# Patient Record
Sex: Female | Born: 1965 | Race: White | Hispanic: No | Smoking: Current every day smoker
Health system: Southern US, Community
[De-identification: ages and names within clinical notes are randomized; demographics above are authoritative.]

## PROBLEM LIST (undated history)

## (undated) DIAGNOSIS — M1990 Unspecified osteoarthritis, unspecified site: Secondary | ICD-10-CM

## (undated) DIAGNOSIS — T7840XA Allergy, unspecified, initial encounter: Secondary | ICD-10-CM

## (undated) DIAGNOSIS — M810 Age-related osteoporosis without current pathological fracture: Secondary | ICD-10-CM

## (undated) DIAGNOSIS — J45909 Unspecified asthma, uncomplicated: Secondary | ICD-10-CM

## (undated) HISTORY — DX: Age-related osteoporosis without current pathological fracture: M81.0

## (undated) HISTORY — DX: Unspecified asthma, uncomplicated: J45.909

## (undated) HISTORY — PX: OTHER SURGICAL HISTORY: SHX169

## (undated) HISTORY — DX: Allergy, unspecified, initial encounter: T78.40XA

## (undated) HISTORY — DX: Unspecified osteoarthritis, unspecified site: M19.90

---

## 1998-04-03 ENCOUNTER — Other Ambulatory Visit: Admission: RE | Admit: 1998-04-03 | Discharge: 1998-04-03 | Payer: Self-pay | Admitting: *Deleted

## 1999-04-16 ENCOUNTER — Other Ambulatory Visit: Admission: RE | Admit: 1999-04-16 | Discharge: 1999-04-16 | Payer: Self-pay | Admitting: *Deleted

## 2000-04-24 ENCOUNTER — Other Ambulatory Visit: Admission: RE | Admit: 2000-04-24 | Discharge: 2000-04-24 | Payer: Self-pay | Admitting: *Deleted

## 2000-10-23 ENCOUNTER — Other Ambulatory Visit: Admission: RE | Admit: 2000-10-23 | Discharge: 2000-10-23 | Payer: Self-pay | Admitting: Obstetrics and Gynecology

## 2001-04-26 ENCOUNTER — Other Ambulatory Visit: Admission: RE | Admit: 2001-04-26 | Discharge: 2001-04-26 | Payer: Self-pay | Admitting: Obstetrics and Gynecology

## 2002-04-27 ENCOUNTER — Other Ambulatory Visit: Admission: RE | Admit: 2002-04-27 | Discharge: 2002-04-27 | Payer: Self-pay | Admitting: Obstetrics and Gynecology

## 2003-05-29 ENCOUNTER — Other Ambulatory Visit: Admission: RE | Admit: 2003-05-29 | Discharge: 2003-05-29 | Payer: Self-pay | Admitting: Obstetrics and Gynecology

## 2004-06-22 DIAGNOSIS — M810 Age-related osteoporosis without current pathological fracture: Secondary | ICD-10-CM

## 2004-06-22 HISTORY — DX: Age-related osteoporosis without current pathological fracture: M81.0

## 2005-09-27 ENCOUNTER — Emergency Department (HOSPITAL_COMMUNITY): Admission: EM | Admit: 2005-09-27 | Discharge: 2005-09-27 | Payer: Self-pay | Admitting: Emergency Medicine

## 2007-03-17 ENCOUNTER — Emergency Department (HOSPITAL_COMMUNITY): Admission: EM | Admit: 2007-03-17 | Discharge: 2007-03-17 | Payer: Self-pay | Admitting: Family Medicine

## 2007-05-29 ENCOUNTER — Emergency Department (HOSPITAL_COMMUNITY): Admission: EM | Admit: 2007-05-29 | Discharge: 2007-05-29 | Payer: Self-pay | Admitting: Emergency Medicine

## 2008-01-03 ENCOUNTER — Ambulatory Visit: Payer: Self-pay | Admitting: Family Medicine

## 2008-01-03 DIAGNOSIS — M79609 Pain in unspecified limb: Secondary | ICD-10-CM

## 2009-03-29 ENCOUNTER — Emergency Department (HOSPITAL_COMMUNITY): Admission: EM | Admit: 2009-03-29 | Discharge: 2009-03-29 | Payer: Self-pay | Admitting: Emergency Medicine

## 2011-12-04 ENCOUNTER — Ambulatory Visit (HOSPITAL_COMMUNITY)
Admission: RE | Admit: 2011-12-04 | Discharge: 2011-12-04 | Disposition: A | Discharge status: Home / Self Care | Payer: 59 | Source: Ambulatory Visit | Attending: Sports Medicine | Admitting: Sports Medicine

## 2011-12-04 ENCOUNTER — Ambulatory Visit (INDEPENDENT_AMBULATORY_CARE_PROVIDER_SITE_OTHER): Payer: 59 | Admitting: Sports Medicine

## 2011-12-04 VITALS — BP 100/68 | Ht 65.0 in | Wt 125.0 lb

## 2011-12-04 DIAGNOSIS — M79609 Pain in unspecified limb: Secondary | ICD-10-CM

## 2011-12-04 DIAGNOSIS — M25579 Pain in unspecified ankle and joints of unspecified foot: Secondary | ICD-10-CM | POA: Insufficient documentation

## 2011-12-04 DIAGNOSIS — M25571 Pain in right ankle and joints of right foot: Secondary | ICD-10-CM | POA: Insufficient documentation

## 2011-12-04 DIAGNOSIS — M25476 Effusion, unspecified foot: Secondary | ICD-10-CM | POA: Insufficient documentation

## 2011-12-04 DIAGNOSIS — M25473 Effusion, unspecified ankle: Secondary | ICD-10-CM | POA: Insufficient documentation

## 2011-12-04 MED ORDER — MELOXICAM 15 MG PO TABS: ORAL_TABLET | ORAL | Status: DC

## 2011-12-04 NOTE — Progress Notes (Signed)
  Subjective:    Patient ID: Kristen Brewer, female    DOB: 29-Jan-1966, 46 y.o.   MRN: [id number]  HPI chief complaint: Right ankle pain  Very pleasant 46 year old female comes in today complaining of 2 weeks of right ankle pain and swelling. She initially began to have problems with this ankle 2 years ago. She had increased pain and swelling with an exercise class that was not associated with any specific trauma. Since that time she's had intermittent pain and swelling. She localizes her pain to the anterior lateral aspect of the ankle but states that the swelling is more diffuse. Swelling is worse at the end of the day. She has not had any recent x-rays of her ankle. She describes the pain as aching in quality and constantly present. It is not made worse with any particular motion or activity. No prior ankle surgeries. She does get occasional numbness and tingling into her foot. No fevers or chills. She's taking both ibuprofen as well as Goody's powders, both of which helped intermittently.  She denies any past medical problems. She takes no chronic medications. She is allergic to codeine.    Review of Systems     Objective:   Physical Exam Well-developed, well-nourished. No acute distress. Awake alert and oriented x3  Right ankle: Full painless range of motion. Positive anterior drawer with a 2+ talar tilt but this is equal to the uninvolved left ankle. Mild soft tissue swelling over the area of the ATF with mild tenderness to palpation here. Slight tenderness to palpation along the peroneal tendons just posterior to the distal fibula but no real pain with resisted foot eversion or dorsiflexion. There is no tenderness along the medial ankle. No tenderness to palpation at the base of the fifth metatarsal or over the navicular. No pain with metatarsal squeeze. She is neurovascular intact distally. She walks without a significant limp.  MSK ultrasound of the right ankle: Limited views of the  peroneal tendons were obtained. Tendons appear to be within normal limits. I do not appreciate any thickening or tearing. No areas of edema or neovascularity.        Assessment & Plan:  1. Right ankle pain of unknown etiology  I'm going to start with x-rays of the right ankle. Her swelling make me suspicious of some sort of joint effusion. I've given her a body helix compression sleeve and a pair of green sports insoles. She's also educated in a home exercise program consisting of Theraband exercises. I will put her on Mobic 15 mg daily for 7 days then when necessary she will followup with me in 3 weeks. If symptoms persist and x-rays are unremarkable we may need to pursue further diagnostic imaging as well as blood work to rule out possible systemic sources of ankle swelling.

## 2011-12-05 ENCOUNTER — Telehealth: Payer: Self-pay | Admitting: Sports Medicine

## 2011-12-05 NOTE — Telephone Encounter (Signed)
I spoke with the patient today on the phone regarding x-rays of her right ankle. X-rays are fairly unremarkable other than some diffuse osteopenia. No significant degenerative changes. No obvious OCD. She will continue with treatment as outlined at yesterday's office visit and will followup with me as scheduled.

## 2011-12-25 ENCOUNTER — Ambulatory Visit (INDEPENDENT_AMBULATORY_CARE_PROVIDER_SITE_OTHER): Payer: 59 | Admitting: Sports Medicine

## 2011-12-25 VITALS — BP 90/60 | Ht 65.0 in | Wt 125.0 lb

## 2011-12-25 DIAGNOSIS — M25373 Other instability, unspecified ankle: Secondary | ICD-10-CM

## 2011-12-25 DIAGNOSIS — M24876 Other specific joint derangements of unspecified foot, not elsewhere classified: Secondary | ICD-10-CM

## 2011-12-25 DIAGNOSIS — M25579 Pain in unspecified ankle and joints of unspecified foot: Secondary | ICD-10-CM

## 2011-12-26 NOTE — Progress Notes (Addendum)
  Subjective:    Patient ID: Kristen Brewer, female    DOB: March 03, 1965, 46 y.o.   MRN: [id number]  HPI Patient comes in today for followup on her right ankle pain and swelling. Symptoms are much improved. Mobic was beneficial and she is now taking it when necessary. Body helix compression sleeve has also been helpful. She wears it when she is active. She has been doing some generalized ankle strengthening with a theraband. Overall she is feeling much better.    Review of Systems     Objective:   Physical Exam Well-developed, well-nourished. No acute distress.  Right ankle: Full range of motion. No effusion. No soft tissue swelling. There is no tenderness to palpation over the ATF or along the peroneal tendons. No tenderness along the Achilles tendon. She still has a 2+ talar tilt are 1+ anterior drawer. No tenderness along the medial ankle. Neurovascularly intact distally. Walking without a limp.  X-rays of the right ankle including AP mortise and lateral views are reviewed. Nothing acute. No joint effusion. No obvious OCD or degenerative changes.      Assessment & Plan:  1. Improved right ankle pain due to to ankle instability  Patient understands the importance of continuing with generalized ankle strengthening given her underlying laxity. She can continue with Mobic when necessary and continue with her compression sleeve when necessary. She can increase activity as tolerated. She will followup if symptoms recur. Patient was given a note to give to her employer asking that she be allowed to wear her tennis shoes while at work.

## 2012-05-28 ENCOUNTER — Ambulatory Visit: Payer: 59

## 2012-05-28 ENCOUNTER — Ambulatory Visit (INDEPENDENT_AMBULATORY_CARE_PROVIDER_SITE_OTHER): Payer: 59 | Admitting: Emergency Medicine

## 2012-05-28 VITALS — BP 95/64 | HR 88 | Temp 98.9°F | Resp 18 | Wt 128.0 lb

## 2012-05-28 DIAGNOSIS — L0390 Cellulitis, unspecified: Secondary | ICD-10-CM

## 2012-05-28 DIAGNOSIS — L0291 Cutaneous abscess, unspecified: Secondary | ICD-10-CM

## 2012-05-28 LAB — POCT CBC
HCT, POC: 41.6 % (ref 37.7–47.9)
Hemoglobin: 13.2 g/dL (ref 12.2–16.2)
Lymph, poc: 3.1 (ref 0.6–3.4)
MCH, POC: 30.9 pg (ref 27–31.2)
MCHC: 31.7 g/dL — AB (ref 31.8–35.4)
WBC: 10.3 10*3/uL — AB (ref 4.6–10.2)

## 2012-05-28 LAB — POCT SEDIMENTATION RATE: POCT SED RATE: 18 mm/hr (ref 0–22)

## 2012-05-28 MED ORDER — AMOXICILLIN-POT CLAVULANATE 875-125 MG PO TABS: 1.0000 | ORAL_TABLET | Freq: Two times a day (BID) | ORAL | Status: DC

## 2012-05-28 NOTE — Patient Instructions (Signed)
Cellulitis Cellulitis is an infection of the skin and the tissue beneath it. The infected area is usually red and tender. Cellulitis occurs most often in the arms and lower legs.   CAUSES   Cellulitis is caused by bacteria that enter the skin through cracks or cuts in the skin. The most common types of bacteria that cause cellulitis are Staphylococcus and Streptococcus. SYMPTOMS    Redness and warmth.   Swelling.   Tenderness or pain.   Fever.  DIAGNOSIS  Your caregiver can usually determine what is wrong based on a physical exam. Blood tests may also be done. TREATMENT   Treatment usually involves taking an antibiotic medicine. HOME CARE INSTRUCTIONS    Take your antibiotics as directed. Finish them even if you start to feel better.   Keep the infected arm or leg elevated to reduce swelling.   Apply a warm cloth to the affected area up to 4 times per day to relieve pain.   Only take over-the-counter or prescription medicines for pain, discomfort, or fever as directed by your caregiver.   Keep all follow-up appointments as directed by your caregiver.  SEEK MEDICAL CARE IF:    You notice red streaks coming from the infected area.   Your red area gets larger or turns dark in color.   Your bone or joint underneath the infected area becomes painful after the skin has healed.   Your infection returns in the same area or another area.   You notice a swollen bump in the infected area.   You develop new symptoms.  SEEK IMMEDIATE MEDICAL CARE IF:    You have a fever.   You feel very sleepy.   You develop vomiting or diarrhea.   You have a general ill feeling (malaise) with muscle aches and pains.  MAKE SURE YOU:    Understand these instructions.   Will watch your condition.   Will get help right away if you are not doing well or get worse.  Document Released: 11/13/2004 Document Revised: 08/05/2011 Document Reviewed: 04/21/2011 [employer] Patient Information 2013  [employer]    

## 2012-05-28 NOTE — Progress Notes (Signed)
  Subjective:    Patient ID: Kristen Brewer, female    DOB: Apr 15, 1965, 47 y.o.   MRN: [id number]  HPI 47 yo female who woke up with itchy, red spot at top of R knee. Reports that it burns, not really painful. Has drawn lines to show spreading of rash. Is roughly 5cm currently. No known injury to knee. States one of her cats may have scratched her. Never had anything like this before. Denies fever/chills. Denies calf or hip pain. Took benadryl and it has continued to increase in size.    Review of Systems  Constitutional: Negative for fever and chills.  Respiratory: Negative.   Cardiovascular: Negative.   Gastrointestinal: Negative.        Objective:   Physical Exam  Constitutional: She appears well-developed and well-nourished.  Cardiovascular: Normal rate and regular rhythm.   Pulmonary/Chest: Effort normal and breath sounds normal.  Musculoskeletal:       Legs: R knee has 5 cm circular area of induration. It is erythematous, swollen, and hot to touch. Full ROM. Knee is stable.    Results for orders placed in visit on 05/28/12  POCT CBC      Result Value Range   WBC 10.3 (*) 4.6 - 10.2 K/uL   Lymph, poc 3.1  0.6 - 3.4   POC LYMPH PERCENT 29.8  10 - 50 %L   MID (cbc) 0.6  0 - 0.9   POC MID % 6.1  0 - 12 %M   POC Granulocyte 6.6  2 - 6.9   Granulocyte percent 64.1  37 - 80 %G   RBC 4.27  4.04 - 5.48 M/uL   Hemoglobin 13.2  12.2 - 16.2 g/dL   HCT, POC 64.3  32.9 - 47.9 %   MCV 97.4 (*) 80 - 97 fL   MCH, POC 30.9  27 - 31.2 pg   MCHC 31.7 (*) 31.8 - 35.4 g/dL   RDW, POC 51.8     Platelet Count, POC 290  142 - 424 K/uL   MPV 9.3  0 - 99.8 fL    [facility], Read by Dr. Cleta Alberts: R knee, normal.     Assessment & Plan:  47 yo female with complaints of rash on knee. Likely cellulitis. Begin augmentin. Advised to take with food. Continue monitoring it and return in 1-2 days if no improvement seen.

## 2013-09-13 ENCOUNTER — Ambulatory Visit (INDEPENDENT_AMBULATORY_CARE_PROVIDER_SITE_OTHER): Payer: 59 | Admitting: Family Medicine

## 2013-09-13 VITALS — BP 100/60 | HR 73 | Temp 98.2°F | Resp 16 | Ht 65.0 in | Wt 128.6 lb

## 2013-09-13 DIAGNOSIS — R52 Pain, unspecified: Secondary | ICD-10-CM

## 2013-09-13 DIAGNOSIS — J069 Acute upper respiratory infection, unspecified: Secondary | ICD-10-CM

## 2013-09-13 DIAGNOSIS — J029 Acute pharyngitis, unspecified: Secondary | ICD-10-CM

## 2013-09-13 DIAGNOSIS — J3489 Other specified disorders of nose and nasal sinuses: Secondary | ICD-10-CM

## 2013-09-13 DIAGNOSIS — R0981 Nasal congestion: Secondary | ICD-10-CM

## 2013-09-13 NOTE — Progress Notes (Signed)
Subjective:    Patient ID: Kristen Brewer, female    DOB: [birth year], 48 y.o.   MRN: [id number]  This chart was scribed for Kristen StaggersJeffrey Harish Bram, MD by Jarvis Morganaylor Ferguson, Medical Scribe. This patient was seen in Room 12 and the patient's care was started at 7:23 PM.  Chief Complaint  Patient presents with  . chills/sweaty    since last p.m.  . Generalized Body Aches  . Sore Throat  . Ear Fullness     HPI  HPI Comments: Kristen Brewer is a 48 y.o. female with a h/o asthma who presents to the [facility] complaining of mild,  intermittent, waxing and waning chills that began last night. She states she is having associated sore throat, headache localized in the front side of head, bilateral ear pain, sinus pressure, congestion, subjective fever, generalized body aches, rhinorrhea and mild cough. She states that she taken [employer] Pow[employer] and Benadryl with no relief. Denies any recent tick bites that she knows of. Denies any recent sick contacts. She denies any shortness of breath, nausea, emesis, diarrhea, abdominal pain, or rash.     Patient Active Problem List   Diagnosis Date Noted  . Ankle pain, right 12/04/2011  . FINGER PAIN 01/03/2008   Past Medical History  Diagnosis Date  . Allergy   . Arthritis   . Asthma   . Osteoporosis    Past Surgical History  Procedure Laterality Date  . Wisdom teeth     Allergies  Allergen Reactions  . Chantix [Varenicline] Other (See Comments)    Makes patient aggitated  . Codeine   . Mucinex [Guaifenesin Er]   . Wellbutrin [Bupropion] Hives   Prior to Admission medications   Not on File   History   Social History  . Marital Status: Married    Spouse Name: N/A    Number of Children: N/A  . Years of Education: N/A   Occupational History  . Not on file.   Social History Main Topics  . Smoking status: Current Every Day Smoker  . Smokeless tobacco: Not on file  . Alcohol Use: Yes  . Drug Use: No  . Sexual Activity:  Yes   Other Topics Concern  . Not on file   Social History Narrative  . No narrative on file      Review of Systems  Constitutional: Positive for fever (subjective) and chills.  HENT: Positive for congestion, ear pain (bilateral), rhinorrhea, sinus pressure and sore throat.   Respiratory: Positive for cough (mild). Negative for shortness of breath.   Gastrointestinal: Negative for nausea, vomiting, abdominal pain and diarrhea.  Musculoskeletal: Positive for myalgias (generalized).  Skin: Negative for rash.  Neurological: Positive for headaches (generalized in front of head).  All other systems reviewed and are negative.      Objective:   Physical Exam  Vitals reviewed. Constitutional: She is oriented to person, place, and time. She appears well-developed and well-nourished. No distress.  HENT:  Head: Normocephalic and atraumatic.  Right Ear: Hearing, external ear and ear canal normal. Tympanic membrane is not erythematous and not retracted.  Left Ear: Hearing, tympanic membrane, external ear and ear canal normal.  Nose: Nose normal.  Mouth/Throat: Uvula is midline. Posterior oropharyngeal erythema (mild) present. No oropharyngeal exudate or posterior oropharyngeal edema.  No tonsillar hypertrophy Left ear normal, no redness, TM normal Right ear minimal fluid at base of TM, no redness or retraction.  Frontal sinuses tender bilaterally  Eyes: Conjunctivae and  EOM are normal. Pupils are equal, round, and reactive to light.  Neck: Normal range of motion.  No lymphadenopathy  Cardiovascular: Normal rate, regular rhythm, normal heart sounds and intact distal pulses.   No murmur heard. Pulmonary/Chest: Effort normal and breath sounds normal. No respiratory distress. She has no wheezes. She has no rhonchi.  Neurological: She is alert and oriented to person, place, and time.  Skin: Skin is warm and dry. No rash noted.  Psychiatric: She has a normal mood and affect. Her behavior is  normal.    Filed Vitals:   09/13/13 1910  BP: 100/60  Pulse: 73  Temp: 98.2 F (36.8 C)  TempSrc: Oral  Resp: 16  Height: 5\' 5"  (1.651 m)  Weight: 128 lb 9.6 oz (58.333 kg)  SpO2: 97%       Assessment & Plan:  Kristen Brewer is a 48 y.o. female Body aches  Sore throat  Sinus congestion  Acute upper respiratory infections of unspecified site   Suspected viral URI, early.  Sx care discussed. Avoiding mucinex as this has flared asthma in past. Note for work today and tomorrow, rtc precautions.   No orders of the defined types were placed in this encounter.   Patient Instructions  Saline nasal spray atleast 4 times per day, drink plenty of fluids.  Return to the clinic or go to the nearest emergency room if any of your symptoms worsen or new symptoms occur.  Upper Respiratory Infection, Adult An upper respiratory infection (URI) is also sometimes known as the common cold. The upper respiratory tract includes the nose, sinuses, throat, trachea, and bronchi. Bronchi are the airways leading to the lungs. Most people improve within 1 week, but symptoms can last up to 2 weeks. A residual cough may last even longer.  CAUSES Many different viruses can infect the tissues lining the upper respiratory tract. The tissues become irritated and inflamed and often become very moist. Mucus production is also common. A cold is contagious. You can easily spread the virus to others by oral contact. This includes kissing, sharing a glass, coughing, or sneezing. Touching your mouth or nose and then touching a surface, which is then touched by another person, can also spread the virus. SYMPTOMS  Symptoms typically develop 1 to 3 days after you come in contact with a cold virus. Symptoms vary from person to person. They may include:  Runny nose.  Sneezing.  Nasal congestion.  Sinus irritation.  Sore throat.  Loss of voice (laryngitis).  Cough.  Fatigue.  Muscle aches.  Loss of  appetite.  Headache.  Low-grade fever. DIAGNOSIS  You might diagnose your own cold based on familiar symptoms, since most people get a cold 2 to 3 times a year. Your caregiver can confirm this based on your exam. Most importantly, your caregiver can check that your symptoms are not due to another disease such as strep throat, sinusitis, pneumonia, asthma, or epiglottitis. Blood tests, throat tests, and X-rays are not necessary to diagnose a common cold, but they may sometimes be helpful in excluding other more serious diseases. Your caregiver will decide if any further tests are required. RISKS AND COMPLICATIONS  You may be at risk for a more severe case of the common cold if you smoke cigarettes, have chronic heart disease (such as heart failure) or lung disease (such as asthma), or if you have a weakened immune system. The very young and very old are also at risk for more serious infections. Bacterial sinusitis,  middle ear infections, and bacterial pneumonia can complicate the common cold. The common cold can worsen asthma and chronic obstructive pulmonary disease (COPD). Sometimes, these complications can require emergency medical care and may be life-threatening. PREVENTION  The best way to protect against getting a cold is to practice good hygiene. Avoid oral or hand contact with people with cold symptoms. Wash your hands often if contact occurs. There is no clear evidence that vitamin C, vitamin E, echinacea, or exercise reduces the chance of developing a cold. However, it is always recommended to get plenty of rest and practice good nutrition. TREATMENT  Treatment is directed at relieving symptoms. There is no cure. Antibiotics are not effective, because the infection is caused by a virus, not by bacteria. Treatment may include:  Increased fluid intake. Sports drinks offer valuable electrolytes, sugars, and fluids.  Breathing heated mist or steam (vaporizer or shower).  Eating chicken soup  or other clear broths, and maintaining good nutrition.  Getting plenty of rest.  Using gargles or lozenges for comfort.  Controlling fevers with ibuprofen or acetaminophen as directed by your caregiver.  Increasing usage of your inhaler if you have asthma. Zinc gel and zinc lozenges, taken in the first 24 hours of the common cold, can shorten the duration and lessen the severity of symptoms. Pain medicines may help with fever, muscle aches, and throat pain. A variety of non-prescription medicines are available to treat congestion and runny nose. Your caregiver can make recommendations and may suggest nasal or lung inhalers for other symptoms.  HOME CARE INSTRUCTIONS   Only take over-the-counter or prescription medicines for pain, discomfort, or fever as directed by your caregiver.  Use a warm mist humidifier or inhale steam from a shower to increase air moisture. This may keep secretions moist and make it easier to breathe.  Drink enough water and fluids to keep your urine clear or pale yellow.  Rest as needed.  Return to work when your temperature has returned to normal or as your caregiver advises. You may need to stay home longer to avoid infecting others. You can also use a face mask and careful hand washing to prevent spread of the virus. SEEK MEDICAL CARE IF:   After the first few days, you feel you are getting worse rather than better.  You need your caregiver's advice about medicines to control symptoms.  You develop chills, worsening shortness of breath, or brown or red sputum. These may be signs of pneumonia.  You develop yellow or brown nasal discharge or pain in the face, especially when you bend forward. These may be signs of sinusitis.  You develop a fever, swollen neck glands, pain with swallowing, or white areas in the back of your throat. These may be signs of strep throat. SEEK IMMEDIATE MEDICAL CARE IF:   You have a fever.  You develop severe or persistent  headache, ear pain, sinus pain, or chest pain.  You develop wheezing, a prolonged cough, cough up blood, or have a change in your usual mucus (if you have chronic lung disease).  You develop sore muscles or a stiff neck. Document Released: 07/30/2000 Document Revised: 04/28/2011 Document Reviewed: 05/11/2013 [facility] Patient Information 2015 [place], [place]. This information is not intended to replace advice given to you by your health care provider. Make sure you discuss any questions you have with your health care provider.     I personally performed the services described in this documentation, which was scribed in my presence. The recorded  information has been reviewed and considered, and addended by me as needed.

## 2013-09-13 NOTE — Patient Instructions (Signed)
Saline nasal spray atleast 4 times per day, drink plenty of fluids.  Return to the clinic or go to the nearest emergency room if any of your symptoms worsen or new symptoms occur.  Upper Respiratory Infection, Adult An upper respiratory infection (URI) is also sometimes known as the common cold. The upper respiratory tract includes the nose, sinuses, throat, trachea, and bronchi. Bronchi are the airways leading to the lungs. Most people improve within 1 week, but symptoms can last up to 2 weeks. A residual cough may last even longer.  CAUSES Many different viruses can infect the tissues lining the upper respiratory tract. The tissues become irritated and inflamed and often become very moist. Mucus production is also common. A cold is contagious. You can easily spread the virus to others by oral contact. This includes kissing, sharing a glass, coughing, or sneezing. Touching your mouth or nose and then touching a surface, which is then touched by another person, can also spread the virus. SYMPTOMS  Symptoms typically develop 1 to 3 days after you come in contact with a cold virus. Symptoms vary from person to person. They may include:  Runny nose.  Sneezing.  Nasal congestion.  Sinus irritation.  Sore throat.  Loss of voice (laryngitis).  Cough.  Fatigue.  Muscle aches.  Loss of appetite.  Headache.  Low-grade fever. DIAGNOSIS  You might diagnose your own cold based on familiar symptoms, since most people get a cold 2 to 3 times a year. Your caregiver can confirm this based on your exam. Most importantly, your caregiver can check that your symptoms are not due to another disease such as strep throat, sinusitis, pneumonia, asthma, or epiglottitis. Blood tests, throat tests, and X-rays are not necessary to diagnose a common cold, but they may sometimes be helpful in excluding other more serious diseases. Your caregiver will decide if any further tests are required. RISKS AND  COMPLICATIONS  You may be at risk for a more severe case of the common cold if you smoke cigarettes, have chronic heart disease (such as heart failure) or lung disease (such as asthma), or if you have a weakened immune system. The very young and very old are also at risk for more serious infections. Bacterial sinusitis, middle ear infections, and bacterial pneumonia can complicate the common cold. The common cold can worsen asthma and chronic obstructive pulmonary disease (COPD). Sometimes, these complications can require emergency medical care and may be life-threatening. PREVENTION  The best way to protect against getting a cold is to practice good hygiene. Avoid oral or hand contact with people with cold symptoms. Wash your hands often if contact occurs. There is no clear evidence that vitamin C, vitamin E, echinacea, or exercise reduces the chance of developing a cold. However, it is always recommended to get plenty of rest and practice good nutrition. TREATMENT  Treatment is directed at relieving symptoms. There is no cure. Antibiotics are not effective, because the infection is caused by a virus, not by bacteria. Treatment may include:  Increased fluid intake. Sports drinks offer valuable electrolytes, sugars, and fluids.  Breathing heated mist or steam (vaporizer or shower).  Eating chicken soup or other clear broths, and maintaining good nutrition.  Getting plenty of rest.  Using gargles or lozenges for comfort.  Controlling fevers with ibuprofen or acetaminophen as directed by your caregiver.  Increasing usage of your inhaler if you have asthma. Zinc gel and zinc lozenges, taken in the first 24 hours of the common cold, can  shorten the duration and lessen the severity of symptoms. Pain medicines may help with fever, muscle aches, and throat pain. A variety of non-prescription medicines are available to treat congestion and runny nose. Your caregiver can make recommendations and may  suggest nasal or lung inhalers for other symptoms.  HOME CARE INSTRUCTIONS   Only take over-the-counter or prescription medicines for pain, discomfort, or fever as directed by your caregiver.  Use a warm mist humidifier or inhale steam from a shower to increase air moisture. This may keep secretions moist and make it easier to breathe.  Drink enough water and fluids to keep your urine clear or pale yellow.  Rest as needed.  Return to work when your temperature has returned to normal or as your caregiver advises. You may need to stay home longer to avoid infecting others. You can also use a face mask and careful hand washing to prevent spread of the virus. SEEK MEDICAL CARE IF:   After the first few days, you feel you are getting worse rather than better.  You need your caregiver's advice about medicines to control symptoms.  You develop chills, worsening shortness of breath, or brown or red sputum. These may be signs of pneumonia.  You develop yellow or brown nasal discharge or pain in the face, especially when you bend forward. These may be signs of sinusitis.  You develop a fever, swollen neck glands, pain with swallowing, or white areas in the back of your throat. These may be signs of strep throat. SEEK IMMEDIATE MEDICAL CARE IF:   You have a fever.  You develop severe or persistent headache, ear pain, sinus pain, or chest pain.  You develop wheezing, a prolonged cough, cough up blood, or have a change in your usual mucus (if you have chronic lung disease).  You develop sore muscles or a stiff neck. Document Released: 07/30/2000 Document Revised: 04/28/2011 Document Reviewed: 05/11/2013 [facility] Patient Information 2015 [place], [place]. This information is not intended to replace advice given to you by your health care provider. Make sure you discuss any questions you have with your health care provider.

## 2013-10-11 ENCOUNTER — Ambulatory Visit (INDEPENDENT_AMBULATORY_CARE_PROVIDER_SITE_OTHER): Payer: 59 | Admitting: Family Medicine

## 2013-10-11 VITALS — BP 90/80 | HR 87 | Temp 98.9°F | Resp 18 | Wt 125.0 lb

## 2013-10-11 DIAGNOSIS — M778 Other enthesopathies, not elsewhere classified: Secondary | ICD-10-CM

## 2013-10-11 DIAGNOSIS — M779 Enthesopathy, unspecified: Principal | ICD-10-CM

## 2013-10-11 MED ORDER — METHYLPREDNISOLONE 4 MG PO KIT: PACK | ORAL | Status: DC

## 2013-10-11 MED ORDER — LIDOCAINE 5 % EX PTCH: 1.0000 | MEDICATED_PATCH | CUTANEOUS | Status: DC

## 2013-10-11 NOTE — Progress Notes (Signed)
   Subjective:    Patient ID: [name], female    DOB: 08-Oct-1965, 48 y.o.   MRN: [id number]  HPI Ms. Boss is a 48yo female who presents with left elbow pain. Onset of symptoms years ago, but acutely worse today. No known acute injury or trauma. She tried lidocaine cream topically, which helped. Location of pain is posterior elbow. Character is a throbbing pain. She denies any pain which wakes her up at night. She tried taking Aleve and Advil today, with little relief. She denies any fevers, chills, myalgias, elbow swelling, or fatigue.  She denies any significant neck pain or stiffness.  She denies any recent travel or tick exposed areas.  Past Medical History  Diagnosis Date  . Allergy   . Arthritis   . Asthma   . Osteoporosis   Medications, alleries, social, and past medical history were reviewed and were updated in the EHR.  Review of Systems As per HPI, 11 point ROS was performed and is otherwise negative.     Objective:   Physical Exam BP 90/80  Pulse 87  Temp(Src) 98.9 F (37.2 C) (Oral)  Resp 18  Wt 125 lb (56.7 kg)  SpO2 100%  LMP 09/27/2013 GEN: The patient is well-developed well-nourished female and in no acute distress.  He is awake alert and oriented x3. SKIN: warm and well-perfused, no rash  EXTR: No lower extremity edema or calf tenderness Neuro: Strength 5/5 globally. Sensation intact throughout. DTRs 2/4 bilaterally. No focal deficits. Vasc: +2 bilateral distal pulses. No edema.  MSK: Examination of the left elbow is without swelling or overlying erythema. TTP over the distal triceps tendon at its insertion. No bony tenderness. No bruising. Mildly positive Tinel's at the medial elbow- though not characteristic of the primary pain according to the patient. Full ROM at the left elbow that is pain free. Pain with resisted elbow extension. Full sensation and strength throughout the left upper extremity.     Assessment & Plan:  1. Left elbow triceps  tendonitis  -Rx topical lidoderm pateches -Rx Medrol Dose Pak -Rest, ice, elevate as tolerated -Exercises for triceps tendonitis given and explained by me. -If she notices persistent or acutely worsening pain, then she should return to clinic or follow-up sooner if needed.  Dr. Joellyn Haff, DO Sports Medicine Fellow

## 2013-10-12 NOTE — Progress Notes (Signed)
Patient discussed with Dr. Pick-Jacobs. Agree with assessment and plan of care per his note.   

## 2013-12-24 ENCOUNTER — Ambulatory Visit: Payer: 59

## 2014-05-11 ENCOUNTER — Ambulatory Visit (INDEPENDENT_AMBULATORY_CARE_PROVIDER_SITE_OTHER): Payer: 59

## 2014-05-11 ENCOUNTER — Ambulatory Visit (INDEPENDENT_AMBULATORY_CARE_PROVIDER_SITE_OTHER): Payer: 59 | Admitting: Family Medicine

## 2014-05-11 VITALS — BP 98/68 | HR 94 | Temp 98.7°F | Resp 18 | Wt 124.0 lb

## 2014-05-11 DIAGNOSIS — R9431 Abnormal electrocardiogram [ECG] [EKG]: Secondary | ICD-10-CM | POA: Diagnosis not present

## 2014-05-11 DIAGNOSIS — R0602 Shortness of breath: Secondary | ICD-10-CM

## 2014-05-11 DIAGNOSIS — R059 Cough, unspecified: Secondary | ICD-10-CM

## 2014-05-11 DIAGNOSIS — J45901 Unspecified asthma with (acute) exacerbation: Secondary | ICD-10-CM | POA: Diagnosis not present

## 2014-05-11 DIAGNOSIS — R0789 Other chest pain: Secondary | ICD-10-CM

## 2014-05-11 DIAGNOSIS — R05 Cough: Secondary | ICD-10-CM

## 2014-05-11 DIAGNOSIS — Z720 Tobacco use: Secondary | ICD-10-CM

## 2014-05-11 DIAGNOSIS — F17200 Nicotine dependence, unspecified, uncomplicated: Secondary | ICD-10-CM

## 2014-05-11 MED ORDER — AZITHROMYCIN 250 MG PO TABS: ORAL_TABLET | ORAL | Status: DC

## 2014-05-11 MED ORDER — ALBUTEROL SULFATE HFA 108 (90 BASE) MCG/ACT IN AERS: 1.0000 | INHALATION_SPRAY | RESPIRATORY_TRACT | Status: DC | PRN

## 2014-05-11 MED ORDER — ALBUTEROL SULFATE (2.5 MG/3ML) 0.083% IN NEBU
2.5000 mg | INHALATION_SOLUTION | Freq: Once | RESPIRATORY_TRACT | Status: AC
  Administered 2014-05-11: 2.5 mg via RESPIRATORY_TRACT

## 2014-05-11 MED ORDER — IPRATROPIUM BROMIDE 0.02 % IN SOLN
0.5000 mg | Freq: Once | RESPIRATORY_TRACT | Status: AC
  Administered 2014-05-11: 0.5 mg via RESPIRATORY_TRACT

## 2014-05-11 NOTE — Progress Notes (Addendum)
Subjective:    Patient ID: Kristen Brewer, female    DOB: 02-13-1966, 49 y.o.   MRN: [id number] This chart was scribed for Meredith StaggersJeffrey Willian Donson, MD by Littie Deedsichard Sun, Medical Scribe. This patient was seen in Room 12 and the patient's care was started at 11:46 AM.    HPI HPI Comments: Kristen Brewer is a 49 y.o. female with a hx of asthma who presents to the [facility] complaining of gradual onset chest tightness with SOB that started yesterday.   Asthma: Her last flare-up of asthma was a few years ago. Patient also reports having cough that started yesterday. Her symptoms are worsened with deep breathing, giving her a burning sensation. She has tried an expired albuterol inhaler with minimal relief, about 10-20 minutes of relief. She used it 3 times yesterday and once today, 2 puffs at a time. Patient denies wheezing, lightheadedness, dizziness, and calf swelling. She also denies any recent long travel. She does smoke. She has a FMHx of MI (grandfather on father's side) and CHF (grandmother on mother's side).  Influenza: Patient states she had the flu earlier this week, starting 4 days ago but resolved yesterday. Her daughter had been in the ER for the flu 5 days ago with a fever of 103 F. Patent had a fever of 102 F this past Monday and Tuesday (4 days ago and 3 days ago), but her symptoms resolved yesterday. She denies fever at this time.  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  Patient Active Problem List   Diagnosis Date Noted  . Ankle pain, right 12/04/2011  . FINGER PAIN 01/03/2008   Past Medical History  Diagnosis Date  . Allergy   . Arthritis   . Asthma   . Osteoporosis    Past Surgical History  Procedure Laterality Date  . Wisdom teeth     Allergies  Allergen Reactions  . Chantix [Varenicline] Other (See Comments)    Makes patient aggitated  . Codeine   . Mucinex [Guaifenesin Er]   . Wellbutrin [Bupropion] Hives   Prior to Admission  medications   Medication Sig Start Date End Date Taking? Authorizing Provider  lidocaine (LIDODERM) 5 % Place 1 patch onto the skin daily. Remove & Discard patch within 12 hours or as directed by MD Patient not taking: Reported on 05/11/2014 10/11/13   Danelle BerryJohn C Pick-Jacobs, DO   History   Social History  . Marital Status: Married    Spouse Name: N/A  . Number of Children: N/A  . Years of Education: N/A   Occupational History  . Not on file.   Social History Main Topics  . Smoking status: Current Every Day Smoker  . Smokeless tobacco: Not on file  . Alcohol Use: Yes  . Drug Use: No  . Sexual Activity: Yes   Other Topics Concern  . Not on file   Social History Narrative     Review of Systems  Constitutional: Negative for fever.  Respiratory: Positive for cough, chest tightness and shortness of breath.   Cardiovascular: Negative for leg swelling.  Neurological: Negative for dizziness and light-headedness.       Objective:   Physical Exam  Constitutional: She is oriented to person, place, and time. She appears well-developed and well-nourished. No distress.  HENT:  Head: Normocephalic and atraumatic.  Mouth/Throat: Oropharynx is clear and moist. No oropharyngeal exudate.  Eyes: Pupils are equal, round, and reactive to light.  Neck: Neck supple.  Cardiovascular:  Normal rate.   Pulmonary/Chest: Effort normal. No respiratory distress. She has wheezes.  Faint coarse breath sounds right lower lobe. Faint wheeze at end of expiration. Few faint coarse breath sounds right middle lobe. Slightly distant breath sounds otherwise.  Musculoskeletal: She exhibits no edema or tenderness.  Calves non-tender with no swelling.  Neurological: She is alert and oriented to person, place, and time. No cranial nerve deficit.  Skin: Skin is warm and dry. No rash noted.  Psychiatric: She has a normal mood and affect. Her behavior is normal.  Vitals reviewed.   Filed Vitals:   05/11/14 1054    BP: 89/71  Pulse: 94  Temp: 98.7 F (37.1 C)  TempSrc: Oral  Resp: 18  Weight: 124 lb (56.246 kg)  SpO2: 97%    EKG: sinus rhythm, possible poor r wave progression- no prior EKG available for review.   Albuterol and atrovent neb given: feels better, able to produce phlegm now. Less short of breath. Clear on exam.    [facility] reading (PRIMARY) by  Dr. Neva Seat: CXR: increased bronchitic markings RLL.      Assessment & Plan:   MANASI DISHON is a 49 y.o. female Asthma with acute exacerbation, unspecified asthma severity - Plan: EKG 12-Lead, albuterol (PROVENTIL) (2.5 MG/3ML) 0.083% nebulizer solution 2.5 mg, ipratropium (ATROVENT) nebulizer solution 0.5 mg, DG Chest 2 View, azithromycin (ZITHROMAX) 250 MG tablet, albuterol (PROVENTIL HFA;VENTOLIN HFA) 108 (90 BASE) MCG/ACT inhaler  Cough - Plan: EKG 12-Lead, albuterol (PROVENTIL) (2.5 MG/3ML) 0.083% nebulizer solution 2.5 mg, ipratropium (ATROVENT) nebulizer solution 0.5 mg, DG Chest 2 View, azithromycin (ZITHROMAX) 250 MG tablet, albuterol (PROVENTIL HFA;VENTOLIN HFA) 108 (90 BASE) MCG/ACT inhaler  Shortness of breath - Plan: EKG 12-Lead, albuterol (PROVENTIL) (2.5 MG/3ML) 0.083% nebulizer solution 2.5 mg, ipratropium (ATROVENT) nebulizer solution 0.5 mg, DG Chest 2 View  Chest tightness - Plan: EKG 12-Lead, albuterol (PROVENTIL) (2.5 MG/3ML) 0.083% nebulizer solution 2.5 mg, ipratropium (ATROVENT) nebulizer solution 0.5 mg, DG Chest 2 View  Nonspecific abnormal electrocardiogram (ECG) (EKG) - Plan: Ambulatory referral to Cardiology  Tobacco use disorder  Suspected asthmatic bronchitis. Improved with albuterol and atrovent neb in office.   -start zpak, new Rx for albuterol. Sx care and RTC/ER precautions discussed.   Abnormal EKG - borderline abnormality in R wave progression. Will refer to cardiology, but RTC/ER precautions given.    Meds ordered this encounter  Medications  . albuterol (PROVENTIL) (2.5 MG/3ML) 0.083% nebulizer  solution 2.5 mg    Sig:   . ipratropium (ATROVENT) nebulizer solution 0.5 mg    Sig:   . azithromycin (ZITHROMAX) 250 MG tablet    Sig: Take 2 pills by mouth on day 1, then 1 pill by mouth per day on days 2 through 5.    Dispense:  6 tablet    Refill:  0  . albuterol (PROVENTIL HFA;VENTOLIN HFA) 108 (90 BASE) MCG/ACT inhaler    Sig: Inhale 1-2 puffs into the lungs every 4 (four) hours as needed for wheezing or shortness of breath.    Dispense:  1 Inhaler    Refill:  0   Patient Instructions  Albuterol up to every 4-6 hours, return if increased need or persistent use required in next 3-4 days.  Start zpak.    I will refer you to cardiologist to look at possible abnormal EKG, but return to the clinic or go to the nearest emergency room if any of your symptoms worsen or new symptoms occur.  Asthma, Acute Bronchospasm Acute bronchospasm caused  by asthma is also referred to as an asthma attack. Bronchospasm means your air passages become narrowed. The narrowing is caused by inflammation and tightening of the muscles in the air tubes (bronchi) in your lungs. This can make it hard to breathe or cause you to wheeze and cough. CAUSES Possible triggers are:  Animal dander from the skin, hair, or feathers of animals.  Dust mites contained in house dust.  Cockroaches.  Pollen from trees or grass.  Mold.  Cigarette or tobacco smoke.  Air pollutants such as dust, household cleaners, hair sprays, aerosol sprays, paint fumes, strong chemicals, or strong odors.  Cold air or weather changes. Cold air may trigger inflammation. Winds increase molds and pollens in the air.  Strong emotions such as crying or laughing hard.  Stress.  Certain medicines such as aspirin or beta-blockers.  Sulfites in foods and drinks, such as dried fruits and wine.  Infections or inflammatory conditions, such as a flu, cold, or inflammation of the nasal membranes (rhinitis).  Gastroesophageal reflux disease  (GERD). GERD is a condition where stomach acid backs up into your esophagus.  Exercise or strenuous activity. SIGNS AND SYMPTOMS   Wheezing.  Excessive coughing, particularly at night.  Chest tightness.  Shortness of breath. DIAGNOSIS  Your health care provider will ask you about your medical history and perform a physical exam. A chest X-ray or blood testing may be performed to look for other causes of your symptoms or other conditions that may have triggered your asthma attack. TREATMENT  Treatment is aimed at reducing inflammation and opening up the airways in your lungs. Most asthma attacks are treated with inhaled medicines. These include quick relief or rescue medicines (such as bronchodilators) and controller medicines (such as inhaled corticosteroids). These medicines are sometimes given through an inhaler or a nebulizer. Systemic steroid medicine taken by mouth or given through an IV tube also can be used to reduce the inflammation when an attack is moderate or severe. Antibiotic medicines are only used if a bacterial infection is present.  HOME CARE INSTRUCTIONS   Rest.  Drink plenty of liquids. This helps the mucus to remain thin and be easily coughed up. Only use caffeine in moderation and do not use alcohol until you have recovered from your illness.  Do not smoke. Avoid being exposed to secondhand smoke.  You play a critical role in keeping yourself in good health. Avoid exposure to things that cause you to wheeze or to have breathing problems.  Keep your medicines up-to-date and available. Carefully follow your health care provider's treatment plan.  Take your medicine exactly as prescribed.  When pollen or pollution is bad, keep windows closed and use an air conditioner or go to places with air conditioning.  Asthma requires careful medical care. See your health care provider for a follow-up as advised. If you are more than [redacted] weeks pregnant and you were prescribed  any new medicines, let your obstetrician know about the visit and how you are doing. Follow up with your health care provider as directed.  After you have recovered from your asthma attack, make an appointment with your outpatient doctor to talk about ways to reduce the likelihood of future attacks. If you do not have a doctor who manages your asthma, make an appointment with a primary care doctor to discuss your asthma. SEEK IMMEDIATE MEDICAL CARE IF:   You are getting worse.  You have trouble breathing. If severe, call your local emergency services (911 in the  [place]  You develop chest pain or discomfort.  You are vomiting.  You are not able to keep fluids down.  You are coughing up yellow, green, brown, or bloody sputum.  You have a fever and your symptoms suddenly get worse.  You have trouble swallowing. MAKE SURE YOU:   Understand these instructions.  Will watch your condition.  Will get help right away if you are not doing well or get worse. Document Released: 05/21/2006 Document Revised: 02/08/2013 Document Reviewed: 08/11/2012 [facility] Patient Information 2015 [place], [place]. This information is not intended to replace advice given to you by your health care provider. Make sure you discuss any questions you have with your health care provider.   Cough, Adult  A cough is a reflex that helps clear your throat and airways. It can help heal the body or may be a reaction to an irritated airway. A cough may only last 2 or 3 weeks (acute) or may last more than 8 weeks (chronic).  CAUSES Acute cough:  Viral or bacterial infections. Chronic cough:  Infections.  Allergies.  Asthma.  Post-nasal drip.  Smoking.  Heartburn or acid reflux.  Some medicines.  Chronic lung problems (COPD).  Cancer. SYMPTOMS   Cough.  Fever.  Chest pain.  Increased breathing rate.  High-pitched whistling sound when breathing (wheezing).  Colored mucus that you cough up  (sputum). TREATMENT   A bacterial cough may be treated with antibiotic medicine.  A viral cough must run its course and will not respond to antibiotics.  Your caregiver may recommend other treatments if you have a chronic cough. HOME CARE INSTRUCTIONS   Only take over-the-counter or prescription medicines for pain, discomfort, or fever as directed by your caregiver. Use cough suppressants only as directed by your caregiver.  Use a cold steam vaporizer or humidifier in your bedroom or home to help loosen secretions.  Sleep in a semi-upright position if your cough is worse at night.  Rest as needed.  Stop smoking if you smoke. SEEK IMMEDIATE MEDICAL CARE IF:   You have pus in your sputum.  Your cough starts to worsen.  You cannot control your cough with suppressants and are losing sleep.  You begin coughing up blood.  You have difficulty breathing.  You develop pain which is getting worse or is uncontrolled with medicine.  You have a fever. MAKE SURE YOU:   Understand these instructions.  Will watch your condition.  Will get help right away if you are not doing well or get worse. Document Released: 08/02/2010 Document Revised: 04/28/2011 Document Reviewed: 08/02/2010 [facility] Patient Information 2015 [place], [place]. This information is not intended to replace advice given to you by your health care provider. Make sure you discuss any questions you have with your health care provider.     I personally performed the services described in this documentation, which was scribed in my presence. The recorded information has been reviewed and considered, and addended by me as needed.

## 2014-05-11 NOTE — Patient Instructions (Addendum)
Albuterol up to every 4-6 hours, return if increased need or persistent use required in next 3-4 days.  Start zpak.    I will refer you to cardiologist to look at possible abnormal EKG, but return to the clinic or go to the nearest emergency room if any of your symptoms worsen or new symptoms occur.  Asthma, Acute Bronchospasm Acute bronchospasm caused by asthma is also referred to as an asthma attack. Bronchospasm means your air passages become narrowed. The narrowing is caused by inflammation and tightening of the muscles in the air tubes (bronchi) in your lungs. This can make it hard to breathe or cause you to wheeze and cough. CAUSES Possible triggers are:  Animal dander from the skin, hair, or feathers of animals.  Dust mites contained in house dust.  Cockroaches.  Pollen from trees or grass.  Mold.  Cigarette or tobacco smoke.  Air pollutants such as dust, household cleaners, hair sprays, aerosol sprays, paint fumes, strong chemicals, or strong odors.  Cold air or weather changes. Cold air may trigger inflammation. Winds increase molds and pollens in the air.  Strong emotions such as crying or laughing hard.  Stress.  Certain medicines such as aspirin or beta-blockers.  Sulfites in foods and drinks, such as dried fruits and wine.  Infections or inflammatory conditions, such as a flu, cold, or inflammation of the nasal membranes (rhinitis).  Gastroesophageal reflux disease (GERD). GERD is a condition where stomach acid backs up into your esophagus.  Exercise or strenuous activity. SIGNS AND SYMPTOMS   Wheezing.  Excessive coughing, particularly at night.  Chest tightness.  Shortness of breath. DIAGNOSIS  Your health care provider will ask you about your medical history and perform a physical exam. A chest X-ray or blood testing may be performed to look for other causes of your symptoms or other conditions that may have triggered your asthma attack. TREATMENT    Treatment is aimed at reducing inflammation and opening up the airways in your lungs. Most asthma attacks are treated with inhaled medicines. These include quick relief or rescue medicines (such as bronchodilators) and controller medicines (such as inhaled corticosteroids). These medicines are sometimes given through an inhaler or a nebulizer. Systemic steroid medicine taken by mouth or given through an IV tube also can be used to reduce the inflammation when an attack is moderate or severe. Antibiotic medicines are only used if a bacterial infection is present.  HOME CARE INSTRUCTIONS   Rest.  Drink plenty of liquids. This helps the mucus to remain thin and be easily coughed up. Only use caffeine in moderation and do not use alcohol until you have recovered from your illness.  Do not smoke. Avoid being exposed to secondhand smoke.  You play a critical role in keeping yourself in good health. Avoid exposure to things that cause you to wheeze or to have breathing problems.  Keep your medicines up-to-date and available. Carefully follow your health care provider's treatment plan.  Take your medicine exactly as prescribed.  When pollen or pollution is bad, keep windows closed and use an air conditioner or go to places with air conditioning.  Asthma requires careful medical care. See your health care provider for a follow-up as advised. If you are more than [redacted] weeks pregnant and you were prescribed any new medicines, let your obstetrician know about the visit and how you are doing. Follow up with your health care provider as directed.  After you have recovered from your asthma attack, make an  appointment with your outpatient doctor to talk about ways to reduce the likelihood of future attacks. If you do not have a doctor who manages your asthma, make an appointment with a primary care doctor to discuss your asthma. SEEK IMMEDIATE MEDICAL CARE IF:   You are getting worse.  You have trouble  breathing. If severe, call your local emergency services (911 in the [place]  You develop chest pain or discomfort.  You are vomiting.  You are not able to keep fluids down.  You are coughing up yellow, green, brown, or bloody sputum.  You have a fever and your symptoms suddenly get worse.  You have trouble swallowing. MAKE SURE YOU:   Understand these instructions.  Will watch your condition.  Will get help right away if you are not doing well or get worse. Document Released: 05/21/2006 Document Revised: 02/08/2013 Document Reviewed: 08/11/2012 [facility] Patient Information 2015 [employer], [place]. This information is not intended to replace advice given to you by your health care provider. Make sure you discuss any questions you have with your health care provider.   Cough, Adult  A cough is a reflex that helps clear your throat and airways. It can help heal the body or may be a reaction to an irritated airway. A cough may only last 2 or 3 weeks (acute) or may last more than 8 weeks (chronic).  CAUSES Acute cough:  Viral or bacterial infections. Chronic cough:  Infections.  Allergies.  Asthma.  Post-nasal drip.  Smoking.  Heartburn or acid reflux.  Some medicines.  Chronic lung problems (COPD).  Cancer. SYMPTOMS   Cough.  Fever.  Chest pain.  Increased breathing rate.  High-pitched whistling sound when breathing (wheezing).  Colored mucus that you cough up (sputum). TREATMENT   A bacterial cough may be treated with antibiotic medicine.  A viral cough must run its course and will not respond to antibiotics.  Your caregiver may recommend other treatments if you have a chronic cough. HOME CARE INSTRUCTIONS   Only take over-the-counter or prescription medicines for pain, discomfort, or fever as directed by your caregiver. Use cough suppressants only as directed by your caregiver.  Use a cold steam vaporizer or humidifier in your bedroom or home to  help loosen secretions.  Sleep in a semi-upright position if your cough is worse at night.  Rest as needed.  Stop smoking if you smoke. SEEK IMMEDIATE MEDICAL CARE IF:   You have pus in your sputum.  Your cough starts to worsen.  You cannot control your cough with suppressants and are losing sleep.  You begin coughing up blood.  You have difficulty breathing.  You develop pain which is getting worse or is uncontrolled with medicine.  You have a fever. MAKE SURE YOU:   Understand these instructions.  Will watch your condition.  Will get help right away if you are not doing well or get worse. Document Released: 08/02/2010 Document Revised: 04/28/2011 Document Reviewed: 08/02/2010 [facility] Patient Information 2015 [employer], [place]. This information is not intended to replace advice given to you by your health care provider. Make sure you discuss any questions you have with your health care provider.

## 2014-06-26 ENCOUNTER — Encounter: Payer: Self-pay | Admitting: Cardiology

## 2014-06-26 ENCOUNTER — Ambulatory Visit (INDEPENDENT_AMBULATORY_CARE_PROVIDER_SITE_OTHER): Payer: 59 | Admitting: Cardiology

## 2014-06-26 VITALS — BP 100/74 | HR 77 | Ht 64.0 in | Wt 124.5 lb

## 2014-06-26 DIAGNOSIS — R9431 Abnormal electrocardiogram [ECG] [EKG]: Secondary | ICD-10-CM

## 2014-06-26 DIAGNOSIS — R002 Palpitations: Secondary | ICD-10-CM | POA: Diagnosis not present

## 2014-06-26 DIAGNOSIS — R012 Other cardiac sounds: Secondary | ICD-10-CM

## 2014-06-26 NOTE — Patient Instructions (Signed)
Your physician has requested that you have an echocardiogram. Echocardiography is a painless test that uses sound waves to create images of your heart. It provides your doctor with information about the size and shape of your heart and how well your heart's chambers and valves are working. This procedure takes approximately one hour. There are no restrictions for this procedure.  Dr Herbie BaltimoreHarding recommends that you follow-up as needed.  You will receive a phone call regarding your results. If the test comes back abnormal, we will schedule you an appointment with him in 1-2 months at that time.

## 2014-06-28 DIAGNOSIS — R002 Palpitations: Secondary | ICD-10-CM | POA: Insufficient documentation

## 2014-06-28 DIAGNOSIS — R012 Other cardiac sounds: Secondary | ICD-10-CM | POA: Insufficient documentation

## 2014-06-28 DIAGNOSIS — R9431 Abnormal electrocardiogram [ECG] [EKG]: Secondary | ICD-10-CM | POA: Insufficient documentation

## 2014-06-28 NOTE — Assessment & Plan Note (Signed)
This did not seem to be a major factor for her. If it does become and concern or if they last longer, then we can consider evaluating first upper be to proceed with an event monitor

## 2014-06-28 NOTE — Progress Notes (Signed)
PATIENT: Kristen BargesLesley A Kludt MRN: [id number] DOB: 1965/11/04 PCP: Shade FloodGREENE,JEFFREY R, MD  Clinic Note: Chief Complaint  Patient presents with  . New Evaluation    referred by Dr Neva SeatGreene for abnormal EKG. patient reports leg swelling, dizziness when she gets up too quicky.    HPI: Kristen Brewer is a 49 y.o. female with a PMH below who presents today for evaluation of an abnormal EKG. Referred by her PCP. She went to see her PCP back in March with what sounded like an exacerbation of her asthma - with shortness of breath and tightness in her chest. Apparently she had an EKG performed whichwas read as abnormal revealing poor R-wave progression in the precordial leads.  Interval History: lastly he notes that she is a family history of coronary disease but has not had any real other episodes of chest tightness pressure in the absence of an asthma exacerbation. She does note some symptoms with headache dizziness. She is very thin but states that she drinks a whole lot. She notes occasional palpitations but no more or less than usual. She notes that when she is more dehydrated. Neck/internal lot with her allergies and thinks that somehow the medicines triggered palpitations.  The remainder of cardiac review of systems is as follows: no chest pain or dyspnea on exertion positive for - shortness of breath and chest tightness associated with asthma. negative for - edema, irregular heartbeat, loss of consciousness, murmur, orthopnea, paroxysmal nocturnal dyspnea, rapid heart rate, shortness of breath or TIA/amaurosis fugax, syncope/near syncope. :  Past Medical History  Diagnosis Date  . Allergy   . Arthritis   . Asthma   . Osteoporosis     Prior Cardiac Evaluation and Past Surgical History: Past Surgical History  Procedure Laterality Date  . Wisdom teeth      Allergies  Allergen Reactions  . Chantix [Varenicline] Other (See Comments)    Makes patient aggitated  . Codeine   . Mucinex  [Guaifenesin Er]   . Wellbutrin [Bupropion] Hives    Current Outpatient Prescriptions  Medication Sig Dispense Refill  . albuterol (PROVENTIL HFA;VENTOLIN HFA) 108 (90 BASE) MCG/ACT inhaler Inhale 1-2 puffs into the lungs every 4 (four) hours as needed for wheezing or shortness of breath. 1 Inhaler 0  . Aspirin-Acetaminophen-Caffeine (GOODY HEADACHE PO) Take 1 packet by mouth as needed (headaches).    . diphenhydrAMINE (BENADRYL) 25 MG tablet Take 25 mg by mouth daily as needed for allergies.    Marland Kitchen. lidocaine (LIDODERM) 5 % Place 1 patch onto the skin daily. Remove & Discard patch within 12 hours or as directed by MD 30 patch 0  . Multiple Vitamin (MULTIVITAMIN) tablet Take 1 tablet by mouth daily.     No current facility-administered medications for this visit.    History   Social History Narrative    family history includes Alzheimer's disease in her paternal grandmother; Asthma in her daughter; Cancer in her father and maternal grandfather; Diabetes in her maternal grandmother; Heart attack in her paternal grandfather; Heart disease in her paternal grandfather; Heart failure in her maternal grandmother; Hyperlipidemia in her mother; Kidney disease in her maternal grandmother.  ROS: A comprehensive Review of Systems - Negative except Symptoms in the history of present illness and colon Respiratory ROS: positive for - cough, shortness of breath, tachypnea and wheezing negative for - cough, hemoptysis, pleuritic pain, shortness of breath, sputum changes or wheezing  PHYSICAL EXAM BP 100/74 mmHg  Pulse 77  Ht 5\' 4"  (1.626 m)  Wt 56.473 kg (124 lb 8 oz)  BMI 21.36 kg/m2 General appearance: alert, cooperative, appears stated age, no distress and Well-nourished and well-groomed Neck: no adenopathy, no carotid bruit, no JVD and supple, symmetrical, trachea midline Lungs: clear to auscultation bilaterally, normal percussion bilaterally and Nonlabored good air movement Heart: normal apical  impulse and RRR. Normal S1 and S2. There is an extra heart sound that it is not sound or murmur probably related to her small body habitus but cannot exclude midsystolic click. Abdomen: soft, non-tender; bowel sounds normal; no masses,  no organomegaly Extremities: extremities normal, atraumatic, no cyanosis or edema Pulses: 2+ and symmetric Skin: Skin color, texture, turgor normal. No rashes or lesions Neurologic: Alert and oriented X 3, normal strength and tone. Normal symmetric reflexes. Normal coordination and gait   Adult ECG Report  Rate: 77 ;  Rhythm: normal sinus rhythm  QRS Axis: -1 ;  PR Interval: 152 ;  QRS Duration: 70 ; QTc: 425;  Voltages: normal It does appear to be poor progression but that's very nonspecific. The   Narrative Interpretation: impression normal EKG  Recent Labs: none available  ASSESSMENT / PLAN: Very pleasant woman who is here for review of her EKG. She also is noticing more than dizziness and palpitations. See his sister with a history of mitral prolapse and has an abnormal heart sound that could be a midsystolic click.  Problem List Items Addressed This Visit    Abnormal cardiac sounds    Not sure what the abnormal heart sound is. I'm concerned that she may have mitral prolapse given her palpitations and if her sister has a diagnosis.This can be assessed with an echocardiogram.      Relevant Orders   EKG 12-Lead (Completed)   Echocardiogram   Abnormal EKG   Relevant Orders   EKG 12-Lead (Completed)   Echocardiogram   Palpitations - Primary    This did not seem to be a major factor for her. If it does become and concern or if they last longer, then we can consider evaluating first upper be to proceed with an event monitor      Relevant Orders   EKG 12-Lead (Completed)   Echocardiogram        Followup:  2 months or when necessary if echo is normal.  Whittley Carandang W. Herbie BaltimoreHARDING, M.D., M.S. Interventional Cardiolgy [facility]

## 2014-06-28 NOTE — Assessment & Plan Note (Addendum)
Not sure what the abnormal heart sound is. I'm concerned that she may have mitral prolapse given her palpitations and if her sister has a diagnosis.This can be assessed with an echocardiogram.

## 2014-06-30 ENCOUNTER — Ambulatory Visit (HOSPITAL_COMMUNITY)
Admission: RE | Admit: 2014-06-30 | Discharge: 2014-06-30 | Disposition: A | Discharge status: Home / Self Care | Payer: 59 | Source: Ambulatory Visit | Attending: Cardiovascular Disease | Admitting: Cardiovascular Disease

## 2014-06-30 DIAGNOSIS — R012 Other cardiac sounds: Secondary | ICD-10-CM | POA: Diagnosis not present

## 2014-06-30 DIAGNOSIS — Z720 Tobacco use: Secondary | ICD-10-CM | POA: Insufficient documentation

## 2014-06-30 DIAGNOSIS — Z8249 Family history of ischemic heart disease and other diseases of the circulatory system: Secondary | ICD-10-CM | POA: Diagnosis not present

## 2014-06-30 DIAGNOSIS — R9431 Abnormal electrocardiogram [ECG] [EKG]: Secondary | ICD-10-CM | POA: Insufficient documentation

## 2014-06-30 DIAGNOSIS — R002 Palpitations: Secondary | ICD-10-CM | POA: Diagnosis not present

## 2014-06-30 HISTORY — PX: TRANSTHORACIC ECHOCARDIOGRAM: SHX275

## 2014-06-30 NOTE — Progress Notes (Signed)
Quick Note:  Echo results: Good news: Essentially normal echocardiogram and normal pump function and normal valve function. No regional wall motion abnormalities = No signs to suggest a previous heart attack. EF: 55-60%.  Marykay LexHARDING, Starr Urias W, MD   ______

## 2014-07-03 ENCOUNTER — Telehealth: Payer: Self-pay | Admitting: *Deleted

## 2014-07-03 NOTE — Telephone Encounter (Signed)
-----   Message from Marykay Lexavid W Harding, MD sent at 06/30/2014  5:44 PM EDT ----- Echo results: Good news: Essentially normal echocardiogram and normal pump function and normal valve function.  No regional wall motion abnormalities = No signs to suggest a previous heart attack. EF: 55-60%.  Marykay LexHARDING, DAVID W, MD

## 2014-07-03 NOTE — Telephone Encounter (Signed)
Spoke to patient. Result given . Verbalized understanding  

## 2014-08-11 ENCOUNTER — Encounter: Payer: Self-pay | Admitting: Cardiology

## 2014-08-17 ENCOUNTER — Ambulatory Visit (INDEPENDENT_AMBULATORY_CARE_PROVIDER_SITE_OTHER): Payer: 59 | Admitting: Cardiology

## 2014-08-17 ENCOUNTER — Encounter: Payer: Self-pay | Admitting: Cardiology

## 2014-08-17 VITALS — BP 120/76 | HR 72 | Ht 64.0 in | Wt 128.3 lb

## 2014-08-17 DIAGNOSIS — R002 Palpitations: Secondary | ICD-10-CM | POA: Diagnosis not present

## 2014-08-17 DIAGNOSIS — R9431 Abnormal electrocardiogram [ECG] [EKG]: Secondary | ICD-10-CM | POA: Diagnosis not present

## 2014-08-17 DIAGNOSIS — F17210 Nicotine dependence, cigarettes, uncomplicated: Secondary | ICD-10-CM

## 2014-08-17 DIAGNOSIS — Z720 Tobacco use: Secondary | ICD-10-CM

## 2014-08-17 NOTE — Progress Notes (Signed)
PCP: Shade FloodGREENE,JEFFREY R, MD  Clinic Note: Chief Complaint  Patient presents with  . Follow-up     no chest pain, no shortness of breath, edema-no more than usual, no pain in legs, no cramping in legs, no lightheadedness, no dizziness  . Palpitations    HPI: Kristen Brewer is a 49 y.o. female with a PMH below who presents today for f/u visit - initially referred for evaluation of an abnormal EKG..Seen 06/26/14.  She also noted chronic palpitations -- has cut back caffiene (2 3L bottles/week)  Echo 06/30/14: - essentially normal. See below  Past Medical History  Diagnosis Date  . Allergy   . Arthritis   . Asthma   . Osteoporosis    Palpitations -  Prior Cardiac Evaluation and Past Surgical History: Past Surgical History  Procedure Laterality Date  . Wisdom teeth    . Transthoracic echocardiogram  06/30/2014    Left ventricle: The cavity size was normal. Wall thickness was normal. Systolic function was normal. The EF was in the range of 55% to 60%. Left ventricular diastolic function parameters were normal.    Interval History: Less frequent palpitations with less caffeine.  No problems lightheadedness, dizziness, weakness or syncope/near syncope.  She thinks that the palpitations are more with stressful situations and with her drinking caffeine. Having cut back automatic caffeine beverages she drinks, this is notably improved. She has not had other cardiac symptoms to speak of to suggest any CAD or CHF.  No chest pain or shortness of breath with rest or exertion. No PND, orthopnea or edema. No palpitations,No TIA/amaurosis fugax symptoms. No melena, hematochezia, hematuria, or epstaxis. No claudication.  ROS: A comprehensive was performed. Review of Systems  Constitutional: Negative for weight loss and malaise/fatigue.  Cardiovascular: Positive for palpitations (less frequent).  Musculoskeletal: Positive for back pain and neck pain.  Neurological: Positive for dizziness  (posittional dizziness).       Back goes numb some  All other systems reviewed and are negative.   Current Outpatient Prescriptions on File Prior to Visit  Medication Sig Dispense Refill  . albuterol (PROVENTIL HFA;VENTOLIN HFA) 108 (90 BASE) MCG/ACT inhaler Inhale 1-2 puffs into the lungs every 4 (four) hours as needed for wheezing or shortness of breath. 1 Inhaler 0  . diphenhydrAMINE (BENADRYL) 25 MG tablet Take 25 mg by mouth daily as needed for allergies.    Marland Kitchen. lidocaine (LIDODERM) 5 % Place 1 patch onto the skin daily. Remove & Discard patch within 12 hours or as directed by MD 30 patch 0  . Multiple Vitamin (MULTIVITAMIN) tablet Take 1 tablet by mouth daily.     No current facility-administered medications on file prior to visit.   Allergies  Allergen Reactions  . Chantix [Varenicline] Other (See Comments)    Makes patient aggitated  . Codeine   . Mucinex [Guaifenesin Er]   . Wellbutrin [Bupropion] Hives    History  Substance Use Topics  . Smoking status: Current Every Day Smoker -- 1.00 packs/day for 35 years    Types: Cigarettes  . Smokeless tobacco: Never Used  . Alcohol Use: 0.0 oz/week    0 Standard drinks or equivalent per week     Comment: ~0.5 drink per year   Family History  Problem Relation Age of Onset  . Cancer Father     Lung Cancer - metastasis to brain and spine  . Asthma Daughter   . Diabetes Maternal Grandmother   . Kidney disease Maternal Grandmother   .  Heart failure Maternal Grandmother     CHF  . Cancer Maternal Grandfather   . Heart disease Paternal Grandfather   . Heart attack Paternal Grandfather   . Hyperlipidemia Mother   . Alzheimer's disease Paternal Grandmother     Wt Readings from Last 3 Encounters:  08/17/14 58.196 kg (128 lb 4.8 oz)  06/26/14 56.473 kg (124 lb 8 oz)  05/11/14 56.246 kg (124 lb)    PHYSICAL EXAM BP 120/76 mmHg  Pulse 72  Ht  (1.626 m)  Wt 58.196 kg (128 lb 4.8 oz)  BMI 22.01 kg/m2 General  appearance: alert, cooperative, appears stated age, no distress and Well-nourished and well-groomed Neck: no adenopathy, no carotid bruit, no JVD and supple, symmetrical, trachea midline Lungs: clear to auscultation bilaterally, normal percussion bilaterally and Nonlabored good air movement Heart: normal apical impulse and RRR. Normal S1 and S2. Soft splitting of the S2 with inspiration. Abdomen: soft, non-tender; bowel sounds normal; no masses, no organomegaly Extremities: extremities normal, atraumatic, no cyanosis or edema Pulses: 2+ and symmetric Skin: Skin color, texture, turgor normal. No rashes or lesions Neurologic: Alert and oriented X 3, normal strength and tone. Normal symmetric reflexes. Normal coordination and gait   Adult ECG Report not done  Other studies Reviewed: Additional studies/ records that were reviewed today include: Echo reviewed above   ASSESSMENT / PLAN: Problem List Items Addressed This Visit    Abnormal EKG - Primary    EKG suggested the possibility of prior infarct. This is not seen on echocardiogram. Likely just a normal variant      Cigarette smoker (Chronic)    Smoking cessation instruction/counseling given:  counseled patient on the dangers of tobacco use, advised patient to stop smoking, and reviewed strategies to maximize success       Palpitations (Chronic)    Most likely benign PACs and PVCs with normal echocardiogram. No syncope or near syncope.  No active anginal heart failure symptoms. Doing better with reduced caffeine intake and adequate hydration.          No orders of the defined types were placed in this encounter.   Meds ordered this encounter  Medications  . Aspirin-Acetaminophen-Caffeine (EXCEDRIN PO)    Sig: Take 1 tablet by mouth as needed.    Currently doing well. No active symptoms. Does not need routine follow up with cardiology. She will contact us if she does have any recurrent rapid palpitations or syncope/near  syncopal symptoms.  Followup: PRN    Marykay Lex, M.D., M.S. Interventional Cardiologist   Pager # [phone]

## 2014-08-17 NOTE — Patient Instructions (Signed)
Dr. Herbie BaltimoreHarding recommends you stay adequately hydrated.   Should you have issues with palpitations, please contact our office.   Dr. Herbie BaltimoreHarding said you can follow up as needed.

## 2014-08-19 ENCOUNTER — Encounter: Payer: Self-pay | Admitting: Cardiology

## 2014-08-19 DIAGNOSIS — F17210 Nicotine dependence, cigarettes, uncomplicated: Secondary | ICD-10-CM | POA: Insufficient documentation

## 2014-08-19 NOTE — Assessment & Plan Note (Signed)
Most likely benign PACs and PVCs with normal echocardiogram. No syncope or near syncope.  No active anginal heart failure symptoms. Doing better with reduced caffeine intake and adequate hydration.

## 2014-08-19 NOTE — Assessment & Plan Note (Signed)
Smoking cessation instruction/counseling given:  counseled patient on the dangers of tobacco use, advised patient to stop smoking, and reviewed strategies to maximize success 

## 2014-08-19 NOTE — Assessment & Plan Note (Signed)
EKG suggested the possibility of prior infarct. This is not seen on echocardiogram. Likely just a normal variant

## 2015-08-01 ENCOUNTER — Other Ambulatory Visit: Payer: Self-pay | Admitting: Oncology

## 2017-05-25 ENCOUNTER — Ambulatory Visit: Payer: 59 | Admitting: Nurse Practitioner

## 2017-05-25 ENCOUNTER — Other Ambulatory Visit (HOSPITAL_COMMUNITY)
Admission: RE | Admit: 2017-05-25 | Discharge: 2017-05-25 | Disposition: A | Discharge status: Home / Self Care | Payer: 59 | Source: Ambulatory Visit | Attending: Family Medicine | Admitting: Family Medicine

## 2017-05-25 ENCOUNTER — Encounter: Payer: Self-pay | Admitting: Nurse Practitioner

## 2017-05-25 VITALS — BP 130/72 | HR 74 | Temp 97.4°F | Ht 64.0 in | Wt 133.8 lb

## 2017-05-25 DIAGNOSIS — Z1159 Encounter for screening for other viral diseases: Secondary | ICD-10-CM

## 2017-05-25 DIAGNOSIS — Z0000 Encounter for general adult medical examination without abnormal findings: Secondary | ICD-10-CM

## 2017-05-25 DIAGNOSIS — Z136 Encounter for screening for cardiovascular disorders: Secondary | ICD-10-CM

## 2017-05-25 DIAGNOSIS — Z124 Encounter for screening for malignant neoplasm of cervix: Secondary | ICD-10-CM | POA: Insufficient documentation

## 2017-05-25 DIAGNOSIS — Z13220 Encounter for screening for lipoid disorders: Secondary | ICD-10-CM

## 2017-05-25 DIAGNOSIS — Z1231 Encounter for screening mammogram for malignant neoplasm of breast: Secondary | ICD-10-CM

## 2017-05-25 NOTE — Patient Instructions (Addendum)
Please sign medical release to get records from [facility].  You will be contacted to schedule mammogram.  Let me know if you change your mind about referral to GI for colonscopy or to enter cologuard order.  Return to lab fasting (6-8hrs prior to blood draw).  Health Maintenance, Female Adopting a healthy lifestyle and getting preventive care can go a long way to promote health and wellness. Talk with your health care provider about what schedule of regular examinations is right for you. This is a good chance for you to check in with your provider about disease prevention and staying healthy. In between checkups, there are plenty of things you can do on your own. Experts have done a lot of research about which lifestyle changes and preventive measures are most likely to keep you healthy. Ask your health care provider for more information. Weight and diet Eat a healthy diet  Be sure to include plenty of vegetables, fruits, low-fat dairy products, and lean protein.  Do not eat a lot of foods high in solid fats, added sugars, or salt.  Get regular exercise. This is one of the most important things you can do for your health. ? Most adults should exercise for at least 150 minutes each week. The exercise should increase your heart rate and make you sweat (moderate-intensity exercise). ? Most adults should also do strengthening exercises at least twice a week. This is in addition to the moderate-intensity exercise.  Maintain a healthy weight  Body mass index (BMI) is a measurement that can be used to identify possible weight problems. It estimates body fat based on height and weight. Your health care provider can help determine your BMI and help you achieve or maintain a healthy weight.  For females 64 years of age and older: ? A BMI below 18.5 is considered underweight. ? A BMI of 18.5 to 24.9 is normal. ? A BMI of 25 to 29.9 is considered overweight. ? A BMI of 30 and above is  considered obese.  Watch levels of cholesterol and blood lipids  You should start having your blood tested for lipids and cholesterol at 53 years of age, then have this test every 5 years.  You may need to have your cholesterol levels checked more often if: ? Your lipid or cholesterol levels are high. ? You are older than 52 years of age. ? You are at high risk for heart disease.  Cancer screening Lung Cancer  Lung cancer screening is recommended for adults 74-65 years old who are at high risk for lung cancer because of a history of smoking.  A yearly low-dose CT scan of the lungs is recommended for people who: ? Currently smoke. ? Have quit within the past 15 years. ? Have at least a 30-pack-year history of smoking. A pack year is smoking an average of one pack of cigarettes a day for 1 year.  Yearly screening should continue until it has been 15 years since you quit.  Yearly screening should stop if you develop a health problem that would prevent you from having lung cancer treatment.  Breast Cancer  Practice breast self-awareness. This means understanding how your breasts normally appear and feel.  It also means doing regular breast self-exams. Let your health care provider know about any changes, no matter how small.  If you are in your 20s or 30s, you should have a clinical breast exam (CBE) by a health care provider every 1-3 years as part of a regular  health exam.  If you are 40 or older, have a CBE every year. Also consider having a breast X-ray (mammogram) every year.  If you have a family history of breast cancer, talk to your health care provider about genetic screening.  If you are at high risk for breast cancer, talk to your health care provider about having an MRI and a mammogram every year.  Breast cancer gene (BRCA) assessment is recommended for women who have family members with BRCA-related cancers. BRCA-related cancers  include: ? Breast. ? Ovarian. ? Tubal. ? Peritoneal cancers.  Results of the assessment will determine the need for genetic counseling and BRCA1 and BRCA2 testing.  Cervical Cancer Your health care provider may recommend that you be screened regularly for cancer of the pelvic organs (ovaries, uterus, and vagina). This screening involves a pelvic examination, including checking for microscopic changes to the surface of your cervix (Pap test). You may be encouraged to have this screening done every 3 years, beginning at age 21.  For women ages 30-65, health care providers may recommend pelvic exams and Pap testing every 3 years, or they may recommend the Pap and pelvic exam, combined with testing for human papilloma virus (HPV), every 5 years. Some types of HPV increase your risk of cervical cancer. Testing for HPV may also be done on women of any age with unclear Pap test results.  Other health care providers may not recommend any screening for nonpregnant women who are considered low risk for pelvic cancer and who do not have symptoms. Ask your health care provider if a screening pelvic exam is right for you.  If you have had past treatment for cervical cancer or a condition that could lead to cancer, you need Pap tests and screening for cancer for at least 20 years after your treatment. If Pap tests have been discontinued, your risk factors (such as having a new sexual partner) need to be reassessed to determine if screening should resume. Some women have medical problems that increase the chance of getting cervical cancer. In these cases, your health care provider may recommend more frequent screening and Pap tests.  Colorectal Cancer  This type of cancer can be detected and often prevented.  Routine colorectal cancer screening usually begins at 52 years of age and continues through 52 years of age.  Your health care provider may recommend screening at an earlier age if you have risk factors  for colon cancer.  Your health care provider may also recommend using home test kits to check for hidden blood in the stool.  A small camera at the end of a tube can be used to examine your colon directly (sigmoidoscopy or colonoscopy). This is done to check for the earliest forms of colorectal cancer.  Routine screening usually begins at age 50.  Direct examination of the colon should be repeated every 5-10 years through 52 years of age. However, you may need to be screened more often if early forms of precancerous polyps or small growths are found.  Skin Cancer  Check your skin from head to toe regularly.  Tell your health care provider about any new moles or changes in moles, especially if there is a change in a mole's shape or color.  Also tell your health care provider if you have a mole that is larger than the size of a pencil eraser.  Always use sunscreen. Apply sunscreen liberally and repeatedly throughout the day.  Protect yourself by wearing long sleeves, pants, a   wide-brimmed hat, and sunglasses whenever you are outside.  Heart disease, diabetes, and high blood pressure  High blood pressure causes heart disease and increases the risk of stroke. High blood pressure is more likely to develop in: ? People who have blood pressure in the high end of the normal range (130-139/85-89 mm Hg). ? People who are overweight or obese. ? People who are African American.  If you are 54-30 years of age, have your blood pressure checked every 3-5 years. If you are 3 years of age or older, have your blood pressure checked every year. You should have your blood pressure measured twice-once when you are at a hospital or clinic, and once when you are not at a hospital or clinic. Record the average of the two measurements. To check your blood pressure when you are not at a hospital or clinic, you can use: ? An automated blood pressure machine at a pharmacy. ? A home blood pressure monitor.  If  you are between 55 years and 21 years old, ask your health care provider if you should take aspirin to prevent strokes.  Have regular diabetes screenings. This involves taking a blood sample to check your fasting blood sugar level. ? If you are at a normal weight and have a low risk for diabetes, have this test once every three years after 52 years of age. ? If you are overweight and have a high risk for diabetes, consider being tested at a younger age or more often. Preventing infection Hepatitis B  If you have a higher risk for hepatitis B, you should be screened for this virus. You are considered at high risk for hepatitis B if: ? You were born in a country where hepatitis B is common. Ask your health care provider which countries are considered high risk. ? Your parents were born in a high-risk country, and you have not been immunized against hepatitis B (hepatitis B vaccine). ? You have HIV or AIDS. ? You use needles to inject street drugs. ? You live with someone who has hepatitis B. ? You have had sex with someone who has hepatitis B. ? You get hemodialysis treatment. ? You take certain medicines for conditions, including cancer, organ transplantation, and autoimmune conditions.  Hepatitis C  Blood testing is recommended for: ? Everyone born from [birth year] through 1965. ? Anyone with known risk factors for hepatitis C.  Sexually transmitted infections (STIs)  You should be screened for sexually transmitted infections (STIs) including gonorrhea and chlamydia if: ? You are sexually active and are younger than 52 years of age. ? You are older than 51 years of age and your health care provider tells you that you are at risk for this type of infection. ? Your sexual activity has changed since you were last screened and you are at an increased risk for chlamydia or gonorrhea. Ask your health care provider if you are at risk.  If you do not have HIV, but are at risk, it may be recommended  that you take a prescription medicine daily to prevent HIV infection. This is called pre-exposure prophylaxis (PrEP). You are considered at risk if: ? You are sexually active and do not regularly use condoms or know the HIV status of your partner(s). ? You take drugs by injection. ? You are sexually active with a partner who has HIV.  Talk with your health care provider about whether you are at high risk of being infected with HIV. If you choose to  begin PrEP, you should first be tested for HIV. You should then be tested every 3 months for as long as you are taking PrEP. Pregnancy  If you are premenopausal and you may become pregnant, ask your health care provider about preconception counseling.  If you may become pregnant, take 400 to 800 micrograms (mcg) of folic acid every day.  If you want to prevent pregnancy, talk to your health care provider about birth control (contraception). Osteoporosis and menopause  Osteoporosis is a disease in which the bones lose minerals and strength with aging. This can result in serious bone fractures. Your risk for osteoporosis can be identified using a bone density scan.  If you are 65 years of age or older, or if you are at risk for osteoporosis and fractures, ask your health care provider if you should be screened.  Ask your health care provider whether you should take a calcium or vitamin D supplement to lower your risk for osteoporosis.  Menopause may have certain physical symptoms and risks.  Hormone replacement therapy may reduce some of these symptoms and risks. Talk to your health care provider about whether hormone replacement therapy is right for you. Follow these instructions at home:  Schedule regular health, dental, and eye exams.  Stay current with your immunizations.  Do not use any tobacco products including cigarettes, chewing tobacco, or electronic cigarettes.  If you are pregnant, do not drink alcohol.  If you are  breastfeeding, limit how much and how often you drink alcohol.  Limit alcohol intake to no more than 1 drink per day for nonpregnant women. One drink equals 12 ounces of beer, 5 ounces of wine, or 1 ounces of hard liquor.  Do not use street drugs.  Do not share needles.  Ask your health care provider for help if you need support or information about quitting drugs.  Tell your health care provider if you often feel depressed.  Tell your health care provider if you have ever been abused or do not feel safe at home. This information is not intended to replace advice given to you by your health care provider. Make sure you discuss any questions you have with your health care provider. Document Released: 08/19/2010 Document Revised: 07/12/2015 Document Reviewed: 11/07/2014 Elsevier Interactive Patient Education  2018 Elsevier Inc.  

## 2017-05-25 NOTE — Progress Notes (Signed)
Subjective:    Patient ID: Kristen Brewer, female    DOB: 1965-05-14, 52 y.o.   MRN: [id number]  Patient presents today for complete physical  HPI  Hx of osteoporosis due to prolong use of depo Provera.(51yrs) Diagnosed by Rocco Pauls. does not remember last dexa scan Needs records from [facility]  Hx of chronic Headache and neck pain: Use of Goody powder 2-3times day as needed.  Immunizations: (TDAP, Hep C screen, Pneumovax, Influenza, zoster)  Health Maintenance  Topic Date Due  . Pap Smear  06/10/1986  . Mammogram  06/10/2015  . Colon Cancer Screening  05/26/2018*  . Tetanus Vaccine  05/26/2018*  . HIV Screening  05/26/2018*  . Flu Shot  09/17/2017  *Topic was postponed. The date shown is not the original due date.   Diet:regular.  Weight:  Wt Readings from Last 3 Encounters:  05/25/17 133 lb 12.8 oz (60.7 kg)  08/17/14 128 lb 4.8 oz (58.2 kg)  06/26/14 124 lb 8 oz (56.5 kg)    Exercise:none.  Fall Risk: Fall Risk  05/25/2017  Falls in the past year? No   Home Safety:home with husband.  Depression/Suicide: Depression screen [facility] 2/9 05/25/2017  Decreased Interest 0  Down, Depressed, Hopeless 0  PHQ - 2 Score 0   No flowsheet data found. Colonoscopy (every 5-50yrs, >50-25yrs):needed, not interested at this time.  Pap Smear (every 70yrs for >21-29 without HPV, every 44yrs for >30-85yrs with HPV):needed  Mammogram (yearly, >68yrs):needed.  Vision:up to date, use of corrective lens.  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  Patient Active Problem List   Diagnosis Date Noted  . Cigarette smoker 08/19/2014  . Palpitations 06/28/2014  . Abnormal cardiac sounds 06/28/2014  . Abnormal EKG 06/28/2014  . Ankle pain, right 12/04/2011  . FINGER PAIN 01/03/2008    Current Outpatient Medications on File Prior to Visit  Medication Sig Dispense Refill  .  Aspirin-Acetaminophen-Caffeine (EXCEDRIN PO) Take 1 tablet by mouth as needed.    . diphenhydrAMINE (BENADRYL) 25 MG tablet Take 25 mg by mouth daily as needed for allergies.    Marland Kitchen lidocaine (LIDODERM) 5 % Place 1 patch onto the skin daily. Remove & Discard patch within 12 hours or as directed by MD 30 patch 0  . Multiple Vitamin (MULTIVITAMIN) tablet Take 1 tablet by mouth daily.    Marland Kitchen albuterol (PROVENTIL HFA;VENTOLIN HFA) 108 (90 BASE) MCG/ACT inhaler Inhale 1-2 puffs into the lungs every 4 (four) hours as needed for wheezing or shortness of breath. (Patient not taking: Reported on 05/25/2017) 1 Inhaler 0   No current facility-administered medications on file prior to visit.     Past Medical History:  Diagnosis Date  . Allergy   . Arthritis   . Asthma   . Osteoporosis     Past Surgical History:  Procedure Laterality Date  . TRANSTHORACIC ECHOCARDIOGRAM  06/30/2014   Left ventricle: The cavity size was normal. Wall thickness was normal. Systolic function was normal. The EF was in the range of 55% to 60%. Left ventricular diastolic function parameters were normal.  . wisdom teeth      Social History   Socioeconomic History  . Marital status: Married    Spouse name: Not on file  . Number of children: Not on file  . Years of education: Not on file  . Highest education level: Not on file  Occupational History  . Not on  file  Social Needs  . Financial resource strain: Not on file  . Food insecurity:    Worry: Not on file    Inability: Not on file  . Transportation needs:    Medical: Not on file    Non-medical: Not on file  Tobacco Use  . Smoking status: Current Every Day Smoker    Packs/day: 1.00    Years: 35.00    Pack years: 35.00    Types: Cigarettes  . Smokeless tobacco: Never Used  Substance and Sexual Activity  . Alcohol use: Yes    Alcohol/week: 0.0 oz    Comment: ~0.5 drink per year  . Drug use: No  . Sexual activity: Not Currently    Birth control/protection:  None  Lifestyle  . Physical activity:    Days per week: Not on file    Minutes per session: Not on file  . Stress: Not on file  Relationships  . Social connections:    Talks on phone: Not on file    Gets together: Not on file    Attends religious service: Not on file    Active member of club or organization: Not on file    Attends meetings of clubs or organizations: Not on file    Relationship status: Not on file  Other Topics Concern  . Not on file  Social History Narrative  . Not on file    Family History  Problem Relation Age of Onset  . Cancer Father        Lung Cancer - metastasis to brain and spine  . Asthma Daughter   . Diabetes Maternal Grandmother   . Kidney disease Maternal Grandmother        ESRD due to DM  . Heart failure Maternal Grandmother        CHF  . Cancer Maternal Grandfather        melanoma  . Dementia Maternal Grandfather [id number]  . Heart disease Paternal Grandfather   . Heart attack Paternal Grandfather 50  . Hyperlipidemia Mother   . Alzheimer's disease Paternal Grandmother [id number]        Review of Systems  Constitutional: Negative for fever, malaise/fatigue and weight loss.  HENT: Negative for congestion and sore throat.   Eyes:       Negative for visual changes  Respiratory: Negative for cough and shortness of breath.   Cardiovascular: Negative for chest pain, palpitations and leg swelling.  Gastrointestinal: Negative for blood in stool, constipation, diarrhea and heartburn.  Genitourinary: Negative for dysuria, frequency and urgency.  Musculoskeletal: Negative for falls, joint pain and myalgias.  Skin: Negative for rash.  Neurological: Negative for dizziness, sensory change and headaches.  Endo/Heme/Allergies: Does not bruise/bleed easily.  Psychiatric/Behavioral: Negative for depression, substance abuse and suicidal ideas. The patient is not nervous/anxious.     Objective:   Vitals:   05/25/17 1335  BP: 130/72  Pulse: 74  Temp: (!) 97.4  F (36.3 C)  SpO2: 97%    Body mass index is 22.97 kg/m.   Physical Examination:  Physical Exam  Constitutional: She is oriented to person, place, and time. She appears well-developed. No distress.  HENT:  Right Ear: External ear normal.  Left Ear: External ear normal.  Nose: Nose normal.  Mouth/Throat: Oropharynx is clear and moist. No oropharyngeal exudate.  Eyes: Pupils are equal, round, and reactive to light. Conjunctivae and EOM are normal.  Cardiovascular: Normal rate, regular rhythm and normal heart sounds.  Pulmonary/Chest: Effort normal and breath sounds  normal. No respiratory distress. She exhibits no tenderness. Right breast exhibits no inverted nipple, no mass, no nipple discharge, no skin change and no tenderness. Left breast exhibits no inverted nipple, no mass, no nipple discharge, no skin change and no tenderness.  Abdominal: Hernia confirmed negative in the right inguinal area and confirmed negative in the left inguinal area.  Genitourinary: Vagina normal and uterus normal. Pelvic exam was performed with patient supine. There is no rash or tenderness on the right labia. There is no rash or tenderness on the left labia. Cervix exhibits no motion tenderness, no discharge and no friability. Right adnexum displays no mass, no tenderness and no fullness. Left adnexum displays no mass, no tenderness and no fullness. No erythema or bleeding in the vagina. No vaginal discharge found.  Musculoskeletal: Normal range of motion. She exhibits no edema.  Lymphadenopathy: No inguinal adenopathy noted on the right or left side.  Neurological: She is alert and oriented to person, place, and time. She has normal reflexes.  Vitals reviewed.   ASSESSMENT and PLAN:  Koya was seen today for establish care.  Diagnoses and all orders for this visit:  Preventative health care -     CBC; Future -     Comprehensive metabolic panel; Future -     TSH; Future -     Lipid panel; Future -      Cytology - PAP -     Hepatitis C Antibody; Future -     MM Digital Screening; Future  Encounter for lipid screening for cardiovascular disease -     Lipid panel; Future  Encounter for hepatitis C screening test for low risk patient -     Hepatitis C Antibody; Future  Breast cancer screening by mammogram -     MM Digital Screening; Future  Encounter for Papanicolaou smear for cervical cancer screening -     Cytology - PAP    No problem-specific Assessment & Plan notes found for this encounter.     Follow up: Return if symptoms worsen or fail to improve.  Alysia Penna, NP

## 2017-05-26 ENCOUNTER — Encounter: Payer: Self-pay | Admitting: Nurse Practitioner

## 2017-05-26 LAB — CYTOLOGY - PAP: Diagnosis: NEGATIVE

## 2017-05-27 ENCOUNTER — Other Ambulatory Visit (INDEPENDENT_AMBULATORY_CARE_PROVIDER_SITE_OTHER): Payer: 59

## 2017-05-27 DIAGNOSIS — Z1159 Encounter for screening for other viral diseases: Secondary | ICD-10-CM | POA: Diagnosis not present

## 2017-05-27 DIAGNOSIS — Z0000 Encounter for general adult medical examination without abnormal findings: Secondary | ICD-10-CM | POA: Diagnosis not present

## 2017-05-27 DIAGNOSIS — Z13220 Encounter for screening for lipoid disorders: Secondary | ICD-10-CM

## 2017-05-27 DIAGNOSIS — Z136 Encounter for screening for cardiovascular disorders: Secondary | ICD-10-CM | POA: Diagnosis not present

## 2017-05-27 LAB — COMPREHENSIVE METABOLIC PANEL
ALBUMIN: 3.8 g/dL (ref 3.5–5.2)
ALT: 12 U/L (ref 0–35)
AST: 13 U/L (ref 0–37)
Alkaline Phosphatase: 66 U/L (ref 39–117)
BUN: 7 mg/dL (ref 6–23)
CHLORIDE: 107 meq/L (ref 96–112)
CO2: 25 meq/L (ref 19–32)
Calcium: 9 mg/dL (ref 8.4–10.5)
Creatinine, Ser: 0.63 mg/dL (ref 0.40–1.20)
GFR: 105.48 mL/min (ref >60.00)
GLUCOSE: 84 mg/dL (ref 70–99)
POTASSIUM: 4.1 meq/L (ref 3.5–5.1)
SODIUM: 141 meq/L (ref 135–145)
Total Bilirubin: 0.4 mg/dL (ref 0.2–1.2)
Total Protein: 6.4 g/dL (ref 6.0–8.3)

## 2017-05-27 LAB — LIPID PANEL
CHOL/HDL RATIO: 3
Cholesterol: 168 mg/dL (ref 0–200)
HDL: 53 mg/dL (ref >39.00)
LDL CALC: 93 mg/dL (ref 0–99)
NONHDL: 114.67
Triglycerides: 107 mg/dL (ref 0.0–149.0)
VLDL: 21.4 mg/dL (ref 0.0–40.0)

## 2017-05-27 LAB — CBC
HEMATOCRIT: 41.9 % (ref 36.0–46.0)
HEMOGLOBIN: 14.2 g/dL (ref 12.0–15.0)
MCHC: 33.8 g/dL (ref 30.0–36.0)
MCV: 94.5 fl (ref 78.0–100.0)
Platelets: 260 10*3/uL (ref 150.0–400.0)
RBC: 4.43 Mil/uL (ref 3.87–5.11)
RDW: 12.5 % (ref 11.5–15.5)
WBC: 7 10*3/uL (ref 4.0–10.5)

## 2017-05-27 LAB — TSH: TSH: 1.97 u[IU]/mL (ref 0.35–4.50)

## 2017-05-28 LAB — HEPATITIS C ANTIBODY
HEP C AB: NONREACTIVE
SIGNAL TO CUT-OFF: 0.01 (ref <1.00)

## 2017-05-29 ENCOUNTER — Other Ambulatory Visit: Payer: Self-pay | Admitting: Nurse Practitioner

## 2017-05-29 ENCOUNTER — Encounter: Payer: Self-pay | Admitting: Nurse Practitioner

## 2017-05-29 DIAGNOSIS — M810 Age-related osteoporosis without current pathological fracture: Secondary | ICD-10-CM

## 2017-05-29 NOTE — Progress Notes (Signed)
Spoke with pt and she verbalized understanding. TLG

## 2017-06-02 ENCOUNTER — Other Ambulatory Visit: Payer: Self-pay | Admitting: Nurse Practitioner

## 2017-06-02 DIAGNOSIS — Z1231 Encounter for screening mammogram for malignant neoplasm of breast: Secondary | ICD-10-CM

## 2017-06-04 ENCOUNTER — Telehealth: Payer: Self-pay | Admitting: Nurse Practitioner

## 2017-06-04 DIAGNOSIS — J45901 Unspecified asthma with (acute) exacerbation: Secondary | ICD-10-CM

## 2017-06-04 DIAGNOSIS — R05 Cough: Secondary | ICD-10-CM

## 2017-06-04 DIAGNOSIS — R059 Cough, unspecified: Secondary | ICD-10-CM

## 2017-06-04 MED ORDER — ALBUTEROL SULFATE HFA 108 (90 BASE) MCG/ACT IN AERS: 1.0000 | INHALATION_SPRAY | RESPIRATORY_TRACT | 0 refills | Status: DC | PRN

## 2017-06-04 NOTE — Telephone Encounter (Signed)
Pt  Phoned   To assess  If  She  Was  In any  Acute  Respiratory   Distress  As  She  Is  Asking  For  A  Refill  Of  Albuterol  That  Was  Written  By Dr Chilton SiGreen   At [facility]  05/10/2017  She  States  She  Is  Having  A  Little  Tightness  For   allergies  And is  Asking if a  Refill  Could  Be  Sent in   As  She  Recently had  A physical  10  Days ago [facility]

## 2017-06-04 NOTE — Telephone Encounter (Signed)
Refill  Request Albuterol inhaler  LOV  05/25/2017 Kristen Brewer  [facility]

## 2017-06-04 NOTE — Telephone Encounter (Signed)
Copied from CRM [id number]. Topic: Quick Communication - Rx Refill/Question >> Jun 04, 2017  2:30 PM Louie BunPalacios Medina, Rosey Batheresa D wrote: Medication: albuterol (PROVENTIL HFA;VENTOLIN HFA) 108 (90 BASE) MCG/ACT inhaler Has the patient contacted their pharmacy? No because its a new rx (Agent: If no, request that the patient contact the pharmacy for the refill.) Preferred Pharmacy (with phone number or street name): [facility] [place] [phone] Agent: Please be advised that RX refills may take up to 3 business days. We ask that you follow-up with your pharmacy.

## 2017-06-04 NOTE — Telephone Encounter (Signed)
Refill sent.

## 2017-06-08 NOTE — Telephone Encounter (Signed)
Called to inform Pt of rx being sent.

## 2017-06-12 ENCOUNTER — Ambulatory Visit (INDEPENDENT_AMBULATORY_CARE_PROVIDER_SITE_OTHER)
Admission: RE | Admit: 2017-06-12 | Discharge: 2017-06-12 | Disposition: A | Discharge status: Home / Self Care | Payer: 59 | Source: Ambulatory Visit | Attending: Nurse Practitioner | Admitting: Nurse Practitioner

## 2017-06-12 DIAGNOSIS — M810 Age-related osteoporosis without current pathological fracture: Secondary | ICD-10-CM | POA: Diagnosis not present

## 2017-07-06 ENCOUNTER — Ambulatory Visit
Admission: RE | Admit: 2017-07-06 | Discharge: 2017-07-06 | Disposition: A | Discharge status: Home / Self Care | Payer: 59 | Source: Ambulatory Visit | Attending: Nurse Practitioner | Admitting: Nurse Practitioner

## 2017-07-06 DIAGNOSIS — Z1231 Encounter for screening mammogram for malignant neoplasm of breast: Secondary | ICD-10-CM

## 2018-03-30 ENCOUNTER — Encounter: Payer: Self-pay | Admitting: Family Medicine

## 2018-03-30 ENCOUNTER — Ambulatory Visit: Payer: 59 | Admitting: Family Medicine

## 2018-03-30 ENCOUNTER — Ambulatory Visit (INDEPENDENT_AMBULATORY_CARE_PROVIDER_SITE_OTHER): Payer: 59

## 2018-03-30 VITALS — BP 118/70 | HR 74 | Temp 97.9°F | Ht 64.0 in | Wt 130.6 lb

## 2018-03-30 DIAGNOSIS — M25531 Pain in right wrist: Secondary | ICD-10-CM

## 2018-03-30 MED ORDER — DICLOFENAC SODIUM 2 % TD SOLN: 1.0000 "application " | Freq: Two times a day (BID) | TRANSDERMAL | 3 refills | Status: DC

## 2018-03-30 NOTE — Progress Notes (Signed)
Kristen Brewer - 53 y.o. female MRN [id number]  Date of birth: 05-16-1965  SUBJECTIVE:  Including CC & ROS.  Chief Complaint  Patient presents with  . Pain    right wrist pain/ 1 month-gotten worse within past weekend/ not sure if injured, hurts with movement    Kristen Brewer is a 53 y.o. female that is presenting with right dorsal wrist pain.  This is been ongoing for about a month.  Is gotten worse recently.  The pain seems to be localized to the dorsal aspect of the scapholunate joint or the distal radius.  Denies any mechanism or inciting event.  No improvement with current modalities.  Pain seems to be getting worse.  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  Denies any history of surgery.  Pain is sharp and stabbing.  She does recall moving boxes and that may have triggered the pain.  No swelling or ecchymosis..   Review of Systems  Constitutional: Negative for fever.  HENT: Negative for congestion.   Respiratory: Negative for cough.   Cardiovascular: Negative for chest pain.  Gastrointestinal: Negative for abdominal pain.  Musculoskeletal: Negative for back pain.  Skin: Negative for color change.  Neurological: Negative for weakness.  Hematological: Negative for adenopathy.  Psychiatric/Behavioral: Negative for agitation.    HISTORY: Past Medical, Surgical, Social, and Family History Reviewed & Updated per EMR.   Pertinent Historical Findings include:  Past Medical History:  Diagnosis Date  . Allergy   . Arthritis   . Asthma   . Osteoporosis 06/22/2004    Past Surgical History:  Procedure Laterality Date  . TRANSTHORACIC ECHOCARDIOGRAM  06/30/2014   Left ventricle: The cavity size was normal. Wall thickness was normal. Systolic function was normal. The EF was in the range of 55% to 60%. Left ventricular diastolic function parameters were normal.  . wisdom teeth      Allergies  Allergen Reactions  . Chantix [Varenicline] Other (See Comments)   Makes patient aggitated  . Codeine   . Mucinex [Guaifenesin Er]   . Wellbutrin [Bupropion] Hives    Family History  Problem Relation Age of Onset  . Cancer Father        Lung Cancer - metastasis to brain and spine  . Asthma Daughter   . Diabetes Maternal Grandmother   . Kidney disease Maternal Grandmother        ESRD due to DM  . Heart failure Maternal Grandmother        CHF  . Cancer Maternal Grandfather        melanoma  . Dementia Maternal Grandfather 32  . Heart disease Paternal Grandfather   . Heart attack Paternal Grandfather 50  . Hyperlipidemia Mother   . Alzheimer's disease Paternal Grandmother 50     Social History   Socioeconomic History  . Marital status: Widowed    Spouse name: Not on file  . Number of children: Not on file  . Years of education: Not on file  . Highest education level: Not on file  Occupational History  . Not on file  Social Needs  . Financial resource strain: Not on file  . Food insecurity:    Worry: Not on file    Inability: Not on file  . Transportation needs:    Medical: Not on file    Non-medical: Not on file  Tobacco Use  . Smoking status: Current Every Day Smoker    Packs/day: 1.00    Years: 35.00  Pack years: 35.00    Types: Cigarettes  . Smokeless tobacco: Never Used  Substance and Sexual Activity  . Alcohol use: Yes    Alcohol/week: 0.0 standard drinks    Comment: ~0.5 drink per year  . Drug use: No  . Sexual activity: Not Currently    Birth control/protection: None  Lifestyle  . Physical activity:    Days per week: Not on file    Minutes per session: Not on file  . Stress: Not on file  Relationships  . Social connections:    Talks on phone: Not on file    Gets together: Not on file    Attends religious service: Not on file    Active member of club or organization: Not on file    Attends meetings of clubs or organizations: Not on file    Relationship status: Not on file  . Intimate partner violence:     Fear of current or ex partner: Not on file    Emotionally abused: Not on file    Physically abused: Not on file    Forced sexual activity: Not on file  Other Topics Concern  . Not on file  Social History Narrative  . Not on file     PHYSICAL EXAM:  VS: BP 118/70   Pulse 74   Temp 97.9 F (36.6 C) (Oral)   Ht 5\' 4"  (1.626 m)   Wt 130 lb 9.6 oz (59.2 kg)   SpO2 100%   BMI 22.42 kg/m  Physical Exam Gen: NAD, alert, cooperative with exam, well-appearing ENT: normal lips, normal nasal mucosa,  Eye: normal EOM, normal conjunctiva and lids CV:  no edema, +2 pedal pulses   Resp: no accessory muscle use, non-labored,  Skin: no rashes, no areas of induration  Neuro: normal tone, normal sensation to touch Psych:  normal insight, alert and oriented MSK:  Right wrist:  No ecchymosis or swelling. Some tenderness to palpation over the distal radial ulnar joint and the scapholunate joint. Normal range of motion. Pain reproduced with extension of the wrist. Normal strength resistance with finger abduction and abduction. Negative Finkelstein's test. Neurovascular intact.  Limited ultrasound: Right wrist:  Normal-appearing distal radius. Normal-appearing scapholunate joint and dynamic and static views. Normal-appearing dorsal wrist compartments. There appears to be hypoechoic change deep in the wrist along the radioscaphoid joint to suggest an effusion and some degenerative changes. No tears of the TFCC appreciated   Summary: effusion to suspect degenerative changes in the wrist   Ultrasound and interpretation by Clare Gandy, MD      ASSESSMENT & PLAN:   Right wrist pain Appears to have effusion in different joints in the wrist. Possible to be related to degenerative changes.  Does not appear to be related to his tenosynovitis or a TFCC tear. -Provided samples of Duexis and Pennsaid. - pennsaid  - counseled on HEP and supportive care - counseled on compression  - if no  improvement consider imaging or injection.

## 2018-03-30 NOTE — Patient Instructions (Signed)
Nice to meet you  Please try the medicine and the rub on medicine  Please try the exercises  Please see me back in 3-4 weeks if no better

## 2018-03-30 NOTE — Assessment & Plan Note (Signed)
Appears to have effusion in different joints in the wrist. Possible to be related to degenerative changes.  Does not appear to be related to his tenosynovitis or a TFCC tear. -Provided samples of Duexis and Pennsaid. - pennsaid  - counseled on HEP and supportive care - counseled on compression  - if no improvement consider imaging or injection.

## 2018-08-05 ENCOUNTER — Telehealth (INDEPENDENT_AMBULATORY_CARE_PROVIDER_SITE_OTHER): Payer: 59 | Admitting: Family Medicine

## 2018-08-05 ENCOUNTER — Encounter: Payer: Self-pay | Admitting: Family Medicine

## 2018-08-05 DIAGNOSIS — H109 Unspecified conjunctivitis: Secondary | ICD-10-CM | POA: Insufficient documentation

## 2018-08-05 DIAGNOSIS — H1032 Unspecified acute conjunctivitis, left eye: Secondary | ICD-10-CM | POA: Diagnosis not present

## 2018-08-05 MED ORDER — POLYMYXIN B-TRIMETHOPRIM 10000-0.1 UNIT/ML-% OP SOLN: 1.0000 [drp] | OPHTHALMIC | 0 refills | Status: AC

## 2018-08-05 NOTE — Progress Notes (Signed)
[name] - 53 y.o. female MRN [id number]  Date of birth: [birth year]   This visit type was conducted due to national recommendations for restrictions regarding the COVID-19 Pandemic (e.g. social distancing).  This format is felt to be most appropriate for this patient at this time.  All issues noted in this document were discussed and addressed.  No physical exam was performed (except for noted visual exam findings with Video Visits).  I discussed the limitations of evaluation and management by telemedicine and the availability of in person appointments. The patient expressed understanding and agreed to proceed.  I connected with@ on 08/05/18 at 10:00 AM EDT by a video enabled telemedicine application and verified that I am speaking with the correct person using two identifiers.   Patient Location: home  [address]  [place] [address]   Provider location:   Home office  Chief Complaint  Patient presents with  . Itchy Eye    L eye , onset 20 hrs, cat sneezed in eye, crusty, gritty     HPI  Kristen Brewer is a 53 y.o. female who presents via audio/video conferencing for a telehealth visit today.  She reports L eye irritation today.  States that her cat "sneezed" in her eye last night and when she woke up this morning her eye was itchy, irritated and crusted over.  She has had some drainage from the eye. She denies pain or vision changes. She does not wear contact lenses.     ROS:  A comprehensive ROS was completed and negative except as noted per HPI  Past Medical History:  Diagnosis Date  . Allergy   . Arthritis   . Asthma   . Osteoporosis 06/22/2004    Past Surgical History:  Procedure Laterality Date  . TRANSTHORACIC ECHOCARDIOGRAM  06/30/2014   Left ventricle: The cavity size was normal. Wall thickness was normal. Systolic function was normal. The EF was in the range of 55% to 60%. Left ventricular diastolic function parameters were normal.  . wisdom  teeth      Family History  Problem Relation Age of Onset  . Cancer Father        Lung Cancer - metastasis to brain and spine  . Asthma Daughter   . Diabetes Maternal Grandmother   . Kidney disease Maternal Grandmother        ESRD due to DM  . Heart failure Maternal Grandmother        CHF  . Cancer Maternal Grandfather        melanoma  . Dementia Maternal Grandfather 45  . Heart disease Paternal Grandfather   . Heart attack Paternal Grandfather 66  . Hyperlipidemia Mother   . Alzheimer's disease Paternal Grandmother 47    Social History   Socioeconomic History  . Marital status: Widowed    Spouse name: Not on file  . Number of children: Not on file  . Years of education: Not on file  . Highest education level: Not on file  Occupational History  . Not on file  Social Needs  . Financial resource strain: Not on file  . Food insecurity    Worry: Not on file    Inability: Not on file  . Transportation needs    Medical: Not on file    Non-medical: Not on file  Tobacco Use  . Smoking status: Current Every Day Smoker    Packs/day: 1.00    Years: 35.00    Pack years: 35.00  Types: Cigarettes  . Smokeless tobacco: Never Used  Substance and Sexual Activity  . Alcohol use: Yes    Alcohol/week: 0.0 standard drinks    Comment: ~0.5 drink per year  . Drug use: No  . Sexual activity: Not Currently    Birth control/protection: None  Lifestyle  . Physical activity    Days per week: Not on file    Minutes per session: Not on file  . Stress: Not on file  Relationships  . Social Musicianconnections    Talks on phone: Not on file    Gets together: Not on file    Attends religious service: Not on file    Active member of club or organization: Not on file    Attends meetings of clubs or organizations: Not on file    Relationship status: Not on file  . Intimate partner violence    Fear of current or ex partner: Not on file    Emotionally abused: Not on file    Physically abused:  Not on file    Forced sexual activity: Not on file  Other Topics Concern  . Not on file  Social History Narrative  . Not on file     Current Outpatient Medications:  .  albuterol (PROVENTIL HFA;VENTOLIN HFA) 108 (90 Base) MCG/ACT inhaler, Inhale 1-2 puffs into the lungs every 4 (four) hours as needed for wheezing or shortness of breath., Disp: 1 Inhaler, Rfl: 0 .  Aspirin-Acetaminophen-Caffeine (EXCEDRIN PO), Take 1 tablet by mouth as needed., Disp: , Rfl:  .  diphenhydrAMINE (BENADRYL) 25 MG tablet, Take 25 mg by mouth daily as needed for allergies., Disp: , Rfl:  .  Multiple Vitamin (MULTIVITAMIN) tablet, Take 1 tablet by mouth daily., Disp: , Rfl:  .  Diclofenac Sodium (PENNSAID) 2 % SOLN, Place 1 application onto the skin 2 (two) times daily. (Patient not taking: Reported on 08/05/2018), Disp: 1 Bottle, Rfl: 3 .  lidocaine (LIDODERM) 5 %, Place 1 patch onto the skin daily. Remove & Discard patch within 12 hours or as directed by MD (Patient not taking: Reported on 08/05/2018), Disp: 30 patch, Rfl: 0 .  trimethoprim-polymyxin b (POLYTRIM) ophthalmic solution, Place 1 drop into the left eye every 4 (four) hours for 10 days., Disp: 10 mL, Rfl: 0  EXAM:  VITALS per patient if applicable: Temp 98.1 F (36.7 C) (Tympanic)   Ht 5\' 4"  (1.626 m)   Wt 130 lb (59 kg)   BMI 22.31 kg/m   GENERAL: alert, oriented, appears well and in no acute distress  HEENT: atraumatic, mild conjunctival injection on the L.  no obvious abnormalities on inspection of external nose and ears  NECK: normal movements of the head and neck  LUNGS: on inspection no signs of respiratory distress, breathing rate appears normal, no obvious gross SOB, gasping or wheezing  CV: no obvious cyanosis  MS: moves all visible extremities without noticeable abnormality  PSYCH/NEURO: pleasant and cooperative, no obvious depression or anxiety, speech and thought processing grossly intact  ASSESSMENT AND PLAN:  Discussed the  following assessment and plan:  Conjunctivitis -Start polytrim drops q4 hours.  -Discussed red flags and to contact office for worsening symptoms, pain or changes in vision.        I discussed the assessment and treatment plan with the patient. The patient was provided an opportunity to ask questions and all were answered. The patient agreed with the plan and demonstrated an understanding of the instructions.   The patient was advised to  call back or seek an in-person evaluation if the symptoms worsen or if the condition fails to improve as anticipated.    Luetta Nutting, DO

## 2018-08-05 NOTE — Assessment & Plan Note (Signed)
-  Start polytrim drops q4 hours.  -Discussed red flags and to contact office for worsening symptoms, pain or changes in vision.

## 2019-09-13 ENCOUNTER — Other Ambulatory Visit: Payer: Self-pay

## 2019-09-14 ENCOUNTER — Encounter: Payer: 59 | Admitting: Nurse Practitioner

## 2019-11-21 ENCOUNTER — Telehealth: Payer: Self-pay | Admitting: Nurse Practitioner

## 2019-11-21 NOTE — Telephone Encounter (Signed)
Reached out to patient regarding cancelled physical appt from 09/14/19. Patient stated she will call back to reschedule appt.

## 2019-11-30 ENCOUNTER — Other Ambulatory Visit: Payer: Self-pay | Admitting: Nurse Practitioner

## 2019-11-30 DIAGNOSIS — R059 Cough, unspecified: Secondary | ICD-10-CM

## 2019-11-30 DIAGNOSIS — J45901 Unspecified asthma with (acute) exacerbation: Secondary | ICD-10-CM

## 2019-12-04 ENCOUNTER — Emergency Department (HOSPITAL_COMMUNITY)
Admission: EM | Admit: 2019-12-04 | Discharge: 2019-12-04 | Disposition: A | Discharge status: Home / Self Care | Payer: 59 | Attending: Emergency Medicine | Admitting: Emergency Medicine

## 2019-12-04 ENCOUNTER — Other Ambulatory Visit: Payer: Self-pay

## 2019-12-04 ENCOUNTER — Emergency Department (HOSPITAL_COMMUNITY): Payer: 59

## 2019-12-04 DIAGNOSIS — R0602 Shortness of breath: Secondary | ICD-10-CM | POA: Diagnosis not present

## 2019-12-04 DIAGNOSIS — R000 Tachycardia, unspecified: Secondary | ICD-10-CM | POA: Diagnosis not present

## 2019-12-04 DIAGNOSIS — Z79899 Other long term (current) drug therapy: Secondary | ICD-10-CM | POA: Diagnosis not present

## 2019-12-04 DIAGNOSIS — J4521 Mild intermittent asthma with (acute) exacerbation: Secondary | ICD-10-CM | POA: Diagnosis not present

## 2019-12-04 DIAGNOSIS — R059 Cough, unspecified: Secondary | ICD-10-CM

## 2019-12-04 DIAGNOSIS — F17210 Nicotine dependence, cigarettes, uncomplicated: Secondary | ICD-10-CM | POA: Diagnosis not present

## 2019-12-04 DIAGNOSIS — J45901 Unspecified asthma with (acute) exacerbation: Secondary | ICD-10-CM | POA: Diagnosis not present

## 2019-12-04 MED ORDER — PREDNISONE 20 MG PO TABS
60.0000 mg | ORAL_TABLET | Freq: Once | ORAL | Status: AC
  Administered 2019-12-04: 60 mg via ORAL
  Filled 2019-12-04: qty 3

## 2019-12-04 MED ORDER — ALBUTEROL SULFATE HFA 108 (90 BASE) MCG/ACT IN AERS
4.0000 | INHALATION_SPRAY | Freq: Once | RESPIRATORY_TRACT | Status: AC
  Administered 2019-12-04: 4 via RESPIRATORY_TRACT
  Filled 2019-12-04: qty 6.7

## 2019-12-04 MED ORDER — ALBUTEROL SULFATE HFA 108 (90 BASE) MCG/ACT IN AERS: 1.0000 | INHALATION_SPRAY | RESPIRATORY_TRACT | 0 refills | Status: DC | PRN

## 2019-12-04 MED ORDER — PREDNISONE 50 MG PO TABS: 50.0000 mg | ORAL_TABLET | Freq: Every day | ORAL | 0 refills | Status: DC

## 2019-12-04 MED ORDER — AEROCHAMBER Z-STAT PLUS/MEDIUM MISC
1.0000 | Freq: Once | Status: AC
  Administered 2019-12-04: 1

## 2019-12-04 NOTE — Discharge Instructions (Addendum)
Take the medications as prescribed.  Follow-up with your doctor if the symptoms have not completely resolved.  Return to the ED as needed for worsening symptoms

## 2019-12-04 NOTE — ED Provider Notes (Signed)
[facility]-EMERGENCY DEPT Provider Note   CSN: [id number] Arrival date & time: 12/04/19  5427     History Chief Complaint  Patient presents with   Shortness of Breath    Kristen Brewer is a 54 y.o. female.  HPI   Pt is having shortness of breath that has progressively gotten worse over the past week or so.  Patient thinks a lot of the symptoms started after she had her flu shot last week.  She was having some issues with body aches as well as soreness at the vaccination site.  Patient started having some issues with shortness of breath.  She does have history of asthma and she smokes.  She has been using her inhaler but her symptoms have persisted.  This morning she felt like she could not breathe so she came to the ED.  She has had a nonproductive cough.  She has not had any fevers.  Patient denies any chest pain.  She has noticed that certain odors exacerbate her symptoms.  Vaccinated against covid. Past Medical History:  Diagnosis Date   Allergy    Arthritis    Asthma    Osteoporosis 06/22/2004    Patient Active Problem List   Diagnosis Date Noted   Conjunctivitis 08/05/2018   Right wrist pain 03/30/2018   Osteoporosis 05/29/2017   Cigarette smoker 08/19/2014   Palpitations 06/28/2014   Abnormal cardiac sounds 06/28/2014   Abnormal EKG 06/28/2014   Ankle pain, right 12/04/2011   FINGER PAIN 01/03/2008    Past Surgical History:  Procedure Laterality Date   TRANSTHORACIC ECHOCARDIOGRAM  06/30/2014   Left ventricle: The cavity size was normal. Wall thickness was normal. Systolic function was normal. The EF was in the range of 55% to 60%. Left ventricular diastolic function parameters were normal.   wisdom teeth       OB History   No obstetric history on file.     Family History  Problem Relation Age of Onset   Cancer Father        Lung Cancer - metastasis to brain and spine   Asthma Daughter    Diabetes Maternal  Grandmother    Kidney disease Maternal Grandmother        ESRD due to DM   Heart failure Maternal Grandmother        CHF   Cancer Maternal Grandfather        melanoma   Dementia Maternal Grandfather 70   Heart disease Paternal Grandfather    Heart attack Paternal Grandfather 1   Hyperlipidemia Mother    Alzheimer's disease Paternal Grandmother 20    Social History   Tobacco Use   Smoking status: Current Every Day Smoker    Packs/day: 1.00    Years: 35.00    Pack years: 35.00    Types: Cigarettes   Smokeless tobacco: Never Used  Building services engineer Use: Never used  Substance Use Topics   Alcohol use: Yes    Alcohol/week: 0.0 standard drinks    Comment: ~0.5 drink per year   Drug use: No    Home Medications Prior to Admission medications   Medication Sig Start Date End Date Taking? Authorizing Provider  albuterol (VENTOLIN HFA) 108 (90 Base) MCG/ACT inhaler Inhale 1-2 puffs into the lungs every 4 (four) hours as needed for wheezing or shortness of breath. 12/04/19   Linwood Dibbles, MD  Aspirin-Acetaminophen-Caffeine (EXCEDRIN PO) Take 1 tablet by mouth as needed.    [provider]  diphenhydrAMINE (BENADRYL) 25 MG tablet Take 25 mg by mouth daily as needed for allergies.    [provider]  Multiple Vitamin (MULTIVITAMIN) tablet Take 1 tablet by mouth daily.    [provider]  predniSONE (DELTASONE) 50 MG tablet Take 1 tablet (50 mg total) by mouth daily. 12/04/19   Linwood Dibbles, MD    Allergies    Chantix [varenicline], Codeine, Mucinex [guaifenesin er], and Wellbutrin [bupropion]  Review of Systems   Review of Systems  All other systems reviewed and are negative.   Physical Exam Updated Vital Signs BP 114/60    Pulse 89    Temp 98 F (36.7 C) (Oral)    Resp (!) 22    Ht 1.626 m (5\' 4" )    Wt 63.5 kg    SpO2 96%    BMI 24.03 kg/m   Physical Exam Vitals and nursing note reviewed.  Constitutional:      General: She is not in  acute distress.    Appearance: She is well-developed.  HENT:     Head: Normocephalic and atraumatic.     Right Ear: External ear normal.     Left Ear: External ear normal.  Eyes:     General: No scleral icterus.       Right eye: No discharge.        Left eye: No discharge.     Conjunctiva/sclera: Conjunctivae normal.  Neck:     Trachea: No tracheal deviation.  Cardiovascular:     Rate and Rhythm: Regular rhythm. Tachycardia present.  Pulmonary:     Effort: Pulmonary effort is normal. No respiratory distress.     Breath sounds: No stridor. Wheezing present. No rales.  Abdominal:     General: Bowel sounds are normal. There is no distension.     Palpations: Abdomen is soft.     Tenderness: There is no abdominal tenderness. There is no guarding or rebound.  Musculoskeletal:        General: No tenderness.     Cervical back: Neck supple.  Skin:    General: Skin is warm and dry.     Findings: No rash.  Neurological:     Mental Status: She is alert.     Cranial Nerves: No cranial nerve deficit (no facial droop, extraocular movements intact, no slurred speech).     Sensory: No sensory deficit.     Motor: No abnormal muscle tone or seizure activity.     Coordination: Coordination normal.     ED Results / Procedures / Treatments   Labs (all labs ordered are listed, but only abnormal results are displayed) Labs Reviewed - No data to display  EKG EKG Interpretation  Date/Time:  Sunday December 04 2019 06:30:04 EDT Ventricular Rate:  99 PR Interval:    QRS Duration: 87 QT Interval:  348 QTC Calculation: 447 R Axis:   42 Text Interpretation: Sinus rhythm Anteroseptal infarct, age indeterminate Rate is faster Confirmed by 05-30-1989 (Paula Libra) on 12/04/2019 6:45:33 AM   Radiology DG Chest 2 View  Result Date: 12/04/2019 CLINICAL DATA:  Shortness of breath EXAM: CHEST - 2 VIEW COMPARISON:  05/11/2014 FINDINGS: The heart size and mediastinal contours are within normal limits.  Both lungs are clear. The visualized skeletal structures are unremarkable. IMPRESSION: No active cardiopulmonary disease. Electronically Signed   By: 05/13/2014 M.D.   On: 12/04/2019 06:41    Procedures Procedures (including critical care time)  Medications Ordered in ED Medications  albuterol (VENTOLIN  HFA) 108 (90 Base) MCG/ACT inhaler 4 puff (4 puffs Inhalation Given 12/04/19 0823)  aerochamber Z-Stat Plus/medium 1 each (1 each Other Given 12/04/19 0823)  predniSONE (DELTASONE) tablet 60 mg (60 mg Oral Given 12/04/19 1308)    ED Course  I have reviewed the triage vital signs and the nursing notes.  Pertinent labs & imaging results that were available during my care of the patient were reviewed by me and considered in my medical decision making (see chart for details).  Clinical Course as of Dec 03 932  Wynelle Link Dec 04, 2019  6578 Chest x-ray reviewed.  No acute findings.   [JK]  0830 Symptoms suggestive of an asthma exacerbation.  Will give patient albuterol treatment as well as a dose of steroids and reassess   [JK]    Clinical Course User Index [JK] Linwood Dibbles, MD   MDM Rules/Calculators/A&P                          Patient presents to the ED for evaluation of shortness of breath.  On exam the patient had wheezing.  Patient was able to speak in full sentences in no distress.  Chest x-ray does not show pneumonia.  Presentation is not concerning for CHF.  No pneumothorax or other abnormality noted on chest x-ray.  Patient was treated with breathing treatments.  She was also given a dose of steroids.  Patient is feeling much better now.  Will discharge home with a course of prednisone and a refill of her albuterol.  Did discuss smoking cessation with the patient. Final Clinical Impression(s) / ED Diagnoses Final diagnoses:  Exacerbation of intermittent asthma, unspecified asthma severity    Rx / DC Orders ED Discharge Orders         Ordered    albuterol (VENTOLIN HFA) 108  (90 Base) MCG/ACT inhaler  Every 4 hours PRN        12/04/19 0933    predniSONE (DELTASONE) 50 MG tablet  Daily        12/04/19 0933           Linwood Dibbles, MD 12/04/19 [phone]

## 2019-12-04 NOTE — ED Triage Notes (Signed)
Pt reports SOB progressing over the last week. States that it started after her flu shot on last week. Repoirts that she has been using her albuterol at home (which she needs refilled) and this morning she felt like she couldn't breath. Also reports cough. Denies fever or sick contacts. Vaccinated against COVID.

## 2020-01-20 ENCOUNTER — Ambulatory Visit: Payer: 59 | Attending: Internal Medicine

## 2020-01-20 DIAGNOSIS — Z23 Encounter for immunization: Secondary | ICD-10-CM

## 2020-01-20 NOTE — Progress Notes (Signed)
   Covid-19 Vaccination Clinic  Name:  Kristen Brewer    MRN: [id number] DOB: [birth year]  01/20/2020  [name] was observed post Covid-19 immunization for 15 minutes without incident. She was provided with Vaccine Information Sheet and instruction to access the V-Safe system.   [name] was instructed to call 911 with any severe reactions post vaccine: Marland Kitchen Difficulty breathing  . Swelling of face and throat  . A fast heartbeat  . A bad rash all over body  . Dizziness and weakness   Immunizations Administered    Name Date Dose VIS Date Route   Pfizer COVID-19 Vaccine 01/20/2020  5:28 PM 0.3 mL 12/07/2019 Intramuscular   Manufacturer: [employer], [employer]   Lot: [id number]   NDC: [address]-7

## 2020-10-07 ENCOUNTER — Encounter (HOSPITAL_COMMUNITY): Payer: Self-pay | Admitting: Oncology

## 2020-10-07 ENCOUNTER — Emergency Department (HOSPITAL_COMMUNITY): Payer: 59

## 2020-10-07 ENCOUNTER — Other Ambulatory Visit: Payer: Self-pay

## 2020-10-07 ENCOUNTER — Emergency Department (HOSPITAL_COMMUNITY)
Admission: EM | Admit: 2020-10-07 | Discharge: 2020-10-07 | Disposition: A | Discharge status: Home / Self Care | Payer: 59 | Attending: Emergency Medicine | Admitting: Emergency Medicine

## 2020-10-07 DIAGNOSIS — R200 Anesthesia of skin: Secondary | ICD-10-CM | POA: Insufficient documentation

## 2020-10-07 DIAGNOSIS — J45909 Unspecified asthma, uncomplicated: Secondary | ICD-10-CM | POA: Diagnosis not present

## 2020-10-07 DIAGNOSIS — R42 Dizziness and giddiness: Secondary | ICD-10-CM | POA: Diagnosis not present

## 2020-10-07 DIAGNOSIS — F17210 Nicotine dependence, cigarettes, uncomplicated: Secondary | ICD-10-CM | POA: Insufficient documentation

## 2020-10-07 DIAGNOSIS — R509 Fever, unspecified: Secondary | ICD-10-CM | POA: Diagnosis not present

## 2020-10-07 DIAGNOSIS — Z7982 Long term (current) use of aspirin: Secondary | ICD-10-CM | POA: Insufficient documentation

## 2020-10-07 DIAGNOSIS — Z20822 Contact with and (suspected) exposure to covid-19: Secondary | ICD-10-CM | POA: Insufficient documentation

## 2020-10-07 DIAGNOSIS — R61 Generalized hyperhidrosis: Secondary | ICD-10-CM | POA: Diagnosis not present

## 2020-10-07 DIAGNOSIS — R079 Chest pain, unspecified: Secondary | ICD-10-CM | POA: Insufficient documentation

## 2020-10-07 DIAGNOSIS — M25512 Pain in left shoulder: Secondary | ICD-10-CM | POA: Diagnosis not present

## 2020-10-07 DIAGNOSIS — R0602 Shortness of breath: Secondary | ICD-10-CM | POA: Diagnosis not present

## 2020-10-07 DIAGNOSIS — R0789 Other chest pain: Secondary | ICD-10-CM | POA: Diagnosis not present

## 2020-10-07 LAB — CBC
HCT: 41.3 % (ref 36.0–46.0)
Hemoglobin: 14.2 g/dL (ref 12.0–15.0)
MCH: 33 pg (ref 26.0–34.0)
MCHC: 34.4 g/dL (ref 30.0–36.0)
MCV: 96 fL (ref 80.0–100.0)
Platelets: 305 10*3/uL (ref 150–400)
RBC: 4.3 MIL/uL (ref 3.87–5.11)
RDW: 12.4 % (ref 11.5–15.5)
WBC: 8.2 10*3/uL (ref 4.0–10.5)
nRBC: 0 % (ref 0.0–0.2)

## 2020-10-07 LAB — BASIC METABOLIC PANEL
Anion gap: 7 (ref 5–15)
BUN: 10 mg/dL (ref 6–20)
CO2: 23 mmol/L (ref 22–32)
Calcium: 9.3 mg/dL (ref 8.9–10.3)
Chloride: 107 mmol/L (ref 98–111)
Creatinine, Ser: 0.75 mg/dL (ref 0.44–1.00)
GFR, Estimated: >60 mL/min (ref >60)
Glucose, Bld: 88 mg/dL (ref 70–99)
Potassium: 4.3 mmol/L (ref 3.5–5.1)
Sodium: 137 mmol/L (ref 135–145)

## 2020-10-07 LAB — RESP PANEL BY RT-PCR (FLU A&B, COVID) ARPGX2
Influenza A by PCR: NEGATIVE
Influenza B by PCR: NEGATIVE
SARS Coronavirus 2 by RT PCR: NEGATIVE

## 2020-10-07 LAB — TROPONIN I (HIGH SENSITIVITY)
Troponin I (High Sensitivity): <2 ng/L (ref <18)
Troponin I (High Sensitivity): 2 ng/L (ref <18)

## 2020-10-07 MED ORDER — ACETAMINOPHEN 325 MG PO TABS
650.0000 mg | ORAL_TABLET | Freq: Once | ORAL | Status: AC
  Administered 2020-10-07: 650 mg via ORAL
  Filled 2020-10-07: qty 2

## 2020-10-07 NOTE — ED Provider Notes (Signed)
[facility]-EMERGENCY DEPT Provider Note   CSN: [id number] Arrival date & time: 10/07/20  0908     History Chief Complaint  Patient presents with   Chest Pain    Kristen Brewer is a 55 y.o. female.   Chest Pain Pain location:  L chest Pain quality: aching, pressure and tightness   Pain radiates to:  L shoulder Pain severity:  Mild Progression:  Resolved Chronicity:  New Context: at rest   Associated symptoms: diaphoresis, fever and numbness   Associated symptoms: no abdominal pain, no altered mental status, no anxiety, no back pain, no claudication, no cough, no dizziness, no fatigue, no headache, no heartburn, no lower extremity edema, no nausea, no shortness of breath, no syncope, no vomiting and no weakness   Risk factors: smoking   Risk factors: no coronary artery disease, no diabetes mellitus, no high cholesterol, no hypertension and not female    HPI: A 55 year old patient presents for evaluation of chest pain. Initial onset of pain was approximately 1-3 hours ago. The patient's chest pain is described as heaviness/pressure/tightness and is not worse with exertion. The patient reports some diaphoresis. The patient's chest pain is middle- or left-sided, is not well-localized, is not sharp and does radiate to the arms/jaw/neck. The patient does not complain of nausea. The patient has smoked in the past 90 days. The patient has no history of stroke, has no history of peripheral artery disease, denies any history of treated diabetes, has no relevant family history of coronary artery disease (first degree relative at less than age 45), is not hypertensive, has no history of hypercholesterolemia and does not have an elevated BMI (>=30).   Past Medical History:  Diagnosis Date   Allergy    Arthritis    Asthma    Osteoporosis 06/22/2004    Patient Active Problem List   Diagnosis Date Noted   Conjunctivitis 08/05/2018   Right wrist pain 03/30/2018    Osteoporosis 05/29/2017   Cigarette smoker 08/19/2014   Palpitations 06/28/2014   Abnormal cardiac sounds 06/28/2014   Abnormal EKG 06/28/2014   Ankle pain, right 12/04/2011   FINGER PAIN 01/03/2008    Past Surgical History:  Procedure Laterality Date   TRANSTHORACIC ECHOCARDIOGRAM  06/30/2014   Left ventricle: The cavity size was normal. Wall thickness was normal. Systolic function was normal. The EF was in the range of 55% to 60%. Left ventricular diastolic function parameters were normal.   wisdom teeth       OB History   No obstetric history on file.     Family History  Problem Relation Age of Onset   Cancer Father        Lung Cancer - metastasis to brain and spine   Asthma Daughter    Diabetes Maternal Grandmother    Kidney disease Maternal Grandmother        ESRD due to DM   Heart failure Maternal Grandmother        CHF   Cancer Maternal Grandfather        melanoma   Dementia Maternal Grandfather 15   Heart disease Paternal Grandfather    Heart attack Paternal Grandfather 40   Hyperlipidemia Mother    Alzheimer's disease Paternal Grandmother 88    Social History   Tobacco Use   Smoking status: Every Day    Packs/day: 1.00    Years: 35.00    Pack years: 35.00    Types: Cigarettes   Smokeless tobacco: Never  Vaping Use  Vaping Use: Never used  Substance Use Topics   Alcohol use: Yes    Alcohol/week: 0.0 standard drinks    Comment: ~0.5 drink per year   Drug use: No    Home Medications Prior to Admission medications   Medication Sig Start Date End Date Taking? Authorizing Provider  albuterol (VENTOLIN HFA) 108 (90 Base) MCG/ACT inhaler Inhale 1-2 puffs into the lungs every 4 (four) hours as needed for wheezing or shortness of breath. 12/04/19   Linwood Dibbles, MD  Aspirin-Acetaminophen-Caffeine (EXCEDRIN PO) Take 1 tablet by mouth as needed.    [provider]  diphenhydrAMINE (BENADRYL) 25 MG tablet Take 25 mg by mouth daily as needed for  allergies.    [provider]  Multiple Vitamin (MULTIVITAMIN) tablet Take 1 tablet by mouth daily.    [provider]  predniSONE (DELTASONE) 50 MG tablet Take 1 tablet (50 mg total) by mouth daily. 12/04/19   Linwood Dibbles, MD    Allergies    Chantix [varenicline], Codeine, Mucinex [guaifenesin er], and Wellbutrin [bupropion]  Review of Systems   Review of Systems  Constitutional:  Positive for diaphoresis and fever. Negative for fatigue.  Respiratory:  Negative for cough and shortness of breath.   Cardiovascular:  Positive for chest pain. Negative for claudication, leg swelling and syncope.  Gastrointestinal:  Negative for abdominal pain, heartburn, nausea and vomiting.  Musculoskeletal:  Negative for back pain.  Neurological:  Positive for numbness. Negative for dizziness, weakness and headaches.  All other systems reviewed and are negative.  Physical Exam Updated Vital Signs BP 100/61   Pulse 83   Temp 100.3 F (37.9 C) (Oral)   Resp 11   Ht 5\' 4"  (1.626 m)   Wt 63.5 kg   SpO2 96%   BMI 24.03 kg/m   Physical Exam Vitals and nursing note reviewed.  Constitutional:      General: She is not in acute distress.    Appearance: She is well-developed.  HENT:     Head: Normocephalic and atraumatic.  Eyes:     Conjunctiva/sclera: Conjunctivae normal.  Cardiovascular:     Rate and Rhythm: Normal rate and regular rhythm.     Heart sounds: No murmur heard. Pulmonary:     Effort: Pulmonary effort is normal. No respiratory distress.     Breath sounds: Normal breath sounds.  Chest:     Chest wall: No tenderness.  Abdominal:     Palpations: Abdomen is soft.     Tenderness: There is no abdominal tenderness.  Musculoskeletal:        General: Normal range of motion.     Cervical back: Neck supple.     Right lower leg: No edema.     Left lower leg: No edema.  Skin:    General: Skin is warm and dry.  Neurological:     General: No focal deficit present.      Mental Status: She is alert and oriented to person, place, and time.     GCS: GCS eye subscore is 4. GCS verbal subscore is 5. GCS motor subscore is 6.     Cranial Nerves: No cranial nerve deficit.     Sensory: Sensation is intact.     Motor: No weakness.     Coordination: Coordination is intact.    ED Results / Procedures / Treatments   Labs (all labs ordered are listed, but only abnormal results are displayed) Labs Reviewed  RESP PANEL BY RT-PCR (FLU A&B, COVID) ARPGX2  BASIC  METABOLIC PANEL  CBC  TROPONIN I (HIGH SENSITIVITY)  TROPONIN I (HIGH SENSITIVITY)    EKG EKG Interpretation  Date/Time:  Sunday October 07 2020 09:18:30 EDT Ventricular Rate:  82 PR Interval:  150 QRS Duration: 83 QT Interval:  354 QTC Calculation: 414 R Axis:   6 Text Interpretation: Sinus rhythm No STEMI Confirmed by Ernie AvenaLawsing, Fidencio Duddy ([id number]) on 10/07/2020 10:02:00 AM  Radiology DG Chest 2 View  Result Date: 10/07/2020 CLINICAL DATA:  Left chest pain radiating to left arm. Shortness of breath. Dizziness. Diaphoresis. EXAM: CHEST - 2 VIEW COMPARISON:  12/04/2019 FINDINGS: The heart size and mediastinal contours are within normal limits. Both lungs are clear. The visualized skeletal structures are unremarkable. IMPRESSION: No active cardiopulmonary disease. Electronically Signed   By: Danae OrleansJohn A Stahl M.D.   On: 10/07/2020 10:15    Procedures Procedures   Medications Ordered in ED Medications  acetaminophen (TYLENOL) tablet 650 mg (650 mg Oral Given 10/07/20 1152)    ED Course  I have reviewed the triage vital signs and the nursing notes.  Pertinent labs & imaging results that were available during my care of the patient were reviewed by me and considered in my medical decision making (see chart for details).    MDM Rules/Calculators/A&P HEAR Score: 4                         Kristen Brewer is a 55 y.o. female who presented to the Emergency Department c/o chest pain. Past medical records have been  reviewed and are notable for every day cigarette smoker 35 pk/yrs.   Pertinent exam findings include:lungs CTAB, chest wall nontender, no murmur, no abdominal TTP, overall well appearing and in no distress.  EKG: Normal sinus rhythm with a rate of 82 and no evidence of acute ischemic changes, abnormal intervals, or dysrhythmia. No concerning change from prior.   Lab results include:CBC without a leukocytosis, anemia or platelet abnormality, troponins x2 negative, COVID-19 and influenza PCR testing negative  Imaging results include: Chest x-ray without acute cardiac or pulm abnormality.  No focal consolidation to suggest bacterial pneumonia.  Course of tx has consisted of:, Provided for temperature 100.3 in triage.  Thought process: Patient presents emerged department with left-sided chest discomfort that has since resolved.  Symptoms were described as pressure sensation radiating to her left shoulder which were brought on at rest.  No productive cough.  No fever or chills at home or sick contacts.  The patient was with a mildly elevated temperature to 100.3 in triage.  She does not describe any pleuritic component to her chest discomfort.  No aggravation or alleviation with positional changes.  Differential diagnosis includes: ACS, pneumonia, pneumothorax,pericarditis/myocarditis, GERD, PUD, musculoskeletal. HEART score of 4, moderate risk.  Symptoms not consistent with ACS or PE.  The patient denies dyspnea, cough, hemoptysis.  She states she has no known history of COPD.  She does have a smoking history. Well's score, low probability. D-dimer not ordered to due low clinical suspicion for PE. Labs unremarkable.  Unlikely pneumonia, no cough, no leukocytosis, no fevers, CXR and exam without acute findings. Unlikely pneumothorax, no findings on CXR. Unlikely pericarditis/myocarditis, does not fit clinical picture and no concerning EKG findings. Chest pain not exertional, since resolved with rest.   Unlikely dissection, no pulse deficit, no tearing chest pain, no neurologic complaints. Due to patient's HEAR of 4, troponins x2 were collected and resulted negative. COVID and influenza negative.   Overall well-appearing in  the emergency department.  Following discussion regarding the patient's risk factors, hear score 4, a referral was placed for follow-up with cardiology in clinic for outpatient stress testing.  Return precautions provided in the event of worsening symptoms.  Patient's clinical presentation is most consistent with nonspecific chest pain and possible viral respiratory illness.  Final Clinical Impression(s) / ED Diagnoses Final diagnoses:  Chest pain, unspecified type    Rx / DC Orders ED Discharge Orders          Ordered    Ambulatory referral to Cardiology        10/07/20 1344             Ernie Avena, MD 10/07/20 1354

## 2020-10-07 NOTE — Discharge Instructions (Addendum)
You were evaluated in the Emergency Department and after careful evaluation, we did not find any emergent condition requiring admission or further testing in the hospital.  Your exam/testing today was overall reassuring.  Please return to the Emergency Department if you experience any worsening of your condition.  Thank you for allowing us to be a part of your care.  

## 2020-10-07 NOTE — ED Triage Notes (Signed)
Pt reports waking up with left arm numbness.  Pt developed left sided chest pain w/ radiation to left arm, shob, dizziness, neck pain and diaphoresis.  Pt speaking in full sentences. Not currently diaphoretic. Pt was also bit by a stray dog while waiting on her ride to ER.  No broken skin noted.

## 2020-10-07 NOTE — ED Notes (Signed)
Patient transported to X-ray 

## 2020-12-23 NOTE — Progress Notes (Signed)
Cardiology Office Note:    Date:  12/25/2020   ID:  Kristen Brewer, DOB November 02, 1965, MRN [id number]  PCP:  Anne Ng, NP  Cardiologist:  None  Electrophysiologist:  None   Referring MD: Ernie Avena, MD   Chief Complaint  Patient presents with   Chest Pain     History of Present Illness:    Kristen Brewer is a 55 y.o. female with a hx of asthma who presents as an ED follow-up for chest pain.  She was seen in the ED on 10/07/2020 with chest pain.  EKG unremarkable.  Troponin negative x2.  She reports that she woke up that morning with left arm numbness and pain in the upper part of the left side of chest.  Describes as dull aching pain.  Lasted for 4 to 5 hours.  Since her ED visit she has continued to have episodes of chest pain.  Has occurred 3 times in last 2 weeks.  No clear relationship with exertion.  Episodes typically last for a few minutes but can last up to 30 minutes.  Also reports occasional dyspnea which she attributes to asthma.  Reports chronic lower extremity edema.  Occasional episodes of lightheadedness but denies any syncope.  Reports rare palpitations that lasts for few beats and resolved.  She has not been exercising.  She has smoked 0.5 to 2 packs/day for over 30 years.  Family history includes sister has mitral valve prolapse.  Paternal grandfather had MI in 41s.  Echocardiogram 07/10/2014 showed EF 55 to 60%, normal diastolic function, no significant valvular disease.    Past Medical History:  Diagnosis Date   Allergy    Arthritis    Asthma    Osteoporosis 06/22/2004    Past Surgical History:  Procedure Laterality Date   TRANSTHORACIC ECHOCARDIOGRAM  06/30/2014   Left ventricle: The cavity size was normal. Wall thickness was normal. Systolic function was normal. The EF was in the range of 55% to 60%. Left ventricular diastolic function parameters were normal.   wisdom teeth      Current Medications: Current Meds  Medication Sig   albuterol  (VENTOLIN HFA) 108 (90 Base) MCG/ACT inhaler Inhale 1-2 puffs into the lungs every 4 (four) hours as needed for wheezing or shortness of breath.   Aspirin-Acetaminophen-Caffeine (EXCEDRIN PO) Take 1 tablet by mouth as needed.   diltiazem (CARDIZEM) 60 MG tablet Take 1 tablet (60 mg) two hours prior to CT scan   diphenhydrAMINE (BENADRYL) 25 MG tablet Take 25 mg by mouth daily as needed for allergies.   Multiple Vitamin (MULTIVITAMIN) tablet Take 1 tablet by mouth daily.     Allergies:   Chantix [varenicline], Codeine, Mucinex [guaifenesin er], and Wellbutrin [bupropion]   Social History   Socioeconomic History   Marital status: Widowed    Spouse name: Not on file   Number of children: Not on file   Years of education: Not on file   Highest education level: Not on file  Occupational History   Not on file  Tobacco Use   Smoking status: Every Day    Packs/day: 1.00    Years: 35.00    Pack years: 35.00    Types: Cigarettes   Smokeless tobacco: Never  Vaping Use   Vaping Use: Never used  Substance and Sexual Activity   Alcohol use: Yes    Alcohol/week: 0.0 standard drinks    Comment: ~0.5 drink per year   Drug use: No   Sexual activity: Not  Currently    Birth control/protection: None  Other Topics Concern   Not on file  Social History Narrative   Not on file   Social Determinants of Health   Financial Resource Strain: Not on file  Food Insecurity: Not on file  Transportation Needs: Not on file  Physical Activity: Not on file  Stress: Not on file  Social Connections: Not on file     Family History: The patient's family history includes Alzheimer's disease (age of onset: 66) in her paternal grandmother; Asthma in her daughter; Cancer in her father and maternal grandfather; Dementia (age of onset: 76) in her maternal grandfather; Diabetes in her maternal grandmother; Heart attack (age of onset: 82) in her paternal grandfather; Heart disease in her paternal grandfather;  Heart failure in her maternal grandmother; Hyperlipidemia in her mother; Kidney disease in her maternal grandmother.  ROS:   Please see the history of present illness.     All other systems reviewed and are negative.  EKGs/Labs/Other Studies Reviewed:    The following studies were reviewed today:   EKG:  EKG is  ordered today.  The ekg ordered today demonstrates NSR, rate 72, no ST banormalities  Recent Labs: 10/07/2020: BUN 10; Creatinine, Ser 0.75; Hemoglobin 14.2; Platelets 305; Potassium 4.3; Sodium 137  Recent Lipid Panel    Component Value Date/Time   CHOL 168 05/27/2017 0800   TRIG 107.0 05/27/2017 0800   HDL 53.00 05/27/2017 0800   CHOLHDL 3 05/27/2017 0800   VLDL 21.4 05/27/2017 0800   LDLCALC 93 05/27/2017 0800    Physical Exam:    VS:  BP 122/77   Pulse 72   Ht 5\' 4"  (1.626 m)   Wt 141 lb 9.6 oz (64.2 kg)   SpO2 98%   BMI 24.31 kg/m     Wt Readings from Last 3 Encounters:  12/25/20 141 lb 9.6 oz (64.2 kg)  10/07/20 140 lb (63.5 kg)  12/04/19 140 lb (63.5 kg)     GEN:  Well nourished, well developed in no acute distress HEENT: Normal NECK: No JVD; No carotid bruits LYMPHATICS: No lymphadenopathy CARDIAC: RRR, no murmurs, rubs, gallops RESPIRATORY:  Clear to auscultation without rales, wheezing or rhonchi  ABDOMEN: Soft, non-tender, non-distended MUSCULOSKELETAL:  No edema; No deformity  SKIN: Warm and dry NEUROLOGIC:  Alert and oriented x 3 PSYCHIATRIC:  Normal affect   ASSESSMENT:    1. Chest pain of uncertain etiology   2. Tobacco use   3. Lipid screening   4. Screening for diabetes mellitus (DM)   5. Pre-procedure lab exam    PLAN:    Chest pain: Atypical in description but does have CAD risk factors (tobacco use, age).  Recommend coronary CTA to rule out obstructive CAD.  Given her asthma history, will give diltiazem prior to study.  Will check echocardiogram to rule out structural heart disease  Tobacco use: Counseled on the risk of  tobacco use and cessation strongly encouraged  Lipid screening: Check lipid panel  Diabetes screening: Check A1c  RTC in 6 months  Medication Adjustments/Labs and Tests Ordered: Current medicines are reviewed at length with the patient today.  Concerns regarding medicines are outlined above.  Orders Placed This Encounter  Procedures   CT CORONARY MORPH W/CTA COR W/SCORE W/CA W/CM &/OR WO/CM   Basic metabolic panel   Hemoglobin A1c   Lipid panel   EKG 12-Lead   ECHOCARDIOGRAM COMPLETE   Meds ordered this encounter  Medications   diltiazem (CARDIZEM) 60 MG  tablet    Sig: Take 1 tablet (60 mg) two hours prior to CT scan    Dispense:  1 tablet    Refill:  0    Patient Instructions  Medication Instructions:  Your physician recommends that you continue on your current medications as directed. Please refer to the Current Medication list given to you today.  *If you need a refill on your cardiac medications before your next appointment, please call your pharmacy*   Lab Work: BMET, Lipid, A1C  If you have labs (blood work) drawn today and your tests are completely normal, you will receive your results only by: MyChart Message (if you have MyChart) OR A paper copy in the mail If you have any lab test that is abnormal or we need to change your treatment, we will call you to review the results.   Testing/Procedures: Coronary CTA-see instructions below  Your physician has requested that you have an echocardiogram. Echocardiography is a painless test that uses sound waves to create images of your heart. It provides your d octor with information about the size and shape of your heart and how well your heart's chambers and valves are working. This procedure takes approximately one hour. There are no restrictions for this procedure. This will be done at our [facility] location:  [address]  Follow-Up: At [facility], you and your health needs are our  priority.  As part of our continuing mission to provide you with exceptional heart care, we have created designated Provider Care Teams.  These Care Teams include your primary Cardiologist (physician) and Advanced Practice Providers (APPs -  Physician Assistants and Nurse Practitioners) who all work together to provide you with the care you need, when you need it.  We recommend signing up for the patient portal called "MyChart".  Sign up information is provided on this After Visit Summary.  MyChart is used to connect with patients for Virtual Visits (Telemedicine).  Patients are able to view lab/test results, encounter notes, upcoming appointments, etc.  Non-urgent messages can be sent to your provider as well.   To learn more about what you can do with MyChart, go to [email or web address].    Your next appointment:   6 month(s)  The format for your next appointment:   In Person  Provider:   Dr. Bjorn Pippin  Other Instructions   Your cardiac CT will be scheduled at one of the below locations:   [facility] [address] [place], [place] [address] [phone]  If scheduled at [facility], please arrive at the [facility] main entrance (entrance A) of [facility] 30 minutes prior to test start time. You can use the FREE valet parking offered at the main entrance (encouraged to control the heart rate for the test) Proceed to the [facility] Radiology Department (first floor) to check-in and test prep.  Please follow these instructions carefully (unless otherwise directed):  On the Night Before the Test: Be sure to Drink plenty of water. Do not consume any caffeinated/decaffeinated beverages or chocolate 12 hours prior to your test. Do not take any antihistamines 12 hours prior to your test.  On the Day of the Test: Drink plenty of water until 1 hour prior to the test. Do not eat any food 4 hours prior to the test. You may take your regular medications  prior to the test.  Take diltiazem 60 mg TWO hours prior FEMALES- please wear underwire-free bra if available,  avoid dresses & tight clothing      After the Test: Drink plenty of water. After receiving IV contrast, you may experience a mild flushed feeling. This is normal. On occasion, you may experience a mild rash up to 24 hours after the test. This is not dangerous. If this occurs, you can take Benadryl 25 mg and increase your fluid intake. If you experience trouble breathing, this can be serious. If it is severe call 911 IMMEDIATELY. If it is mild, please call our office. If you take any of these medications: Glipizide/Metformin, Avandament, Glucavance, please do not take 48 hours after completing test unless otherwise instructed.  Please allow 2-4 weeks for scheduling of routine cardiac CTs. Some insurance companies require a pre-authorization which may delay scheduling of this test.   For non-scheduling related questions, please contact the cardiac imaging nurse navigator should you have any questions/concerns: Rockwell [place], Cardiac Imaging Nurse Navigator Larey Brick, Cardiac Imaging Nurse Navigator [facility] Direct Office Dial: [phone]   For scheduling needs, including cancellations and rescheduling, please call [place], [phone].    Signed, Little Ishikawa, MD  12/25/2020 10:23 PM    [facility]

## 2020-12-25 ENCOUNTER — Other Ambulatory Visit: Payer: Self-pay

## 2020-12-25 ENCOUNTER — Ambulatory Visit: Payer: 59 | Admitting: Cardiology

## 2020-12-25 ENCOUNTER — Encounter: Payer: Self-pay | Admitting: Cardiology

## 2020-12-25 ENCOUNTER — Other Ambulatory Visit (HOSPITAL_COMMUNITY): Payer: Self-pay

## 2020-12-25 VITALS — BP 122/77 | HR 72 | Ht 64.0 in | Wt 141.6 lb

## 2020-12-25 DIAGNOSIS — Z13220 Encounter for screening for lipoid disorders: Secondary | ICD-10-CM

## 2020-12-25 DIAGNOSIS — Z131 Encounter for screening for diabetes mellitus: Secondary | ICD-10-CM

## 2020-12-25 DIAGNOSIS — Z720 Tobacco use: Secondary | ICD-10-CM | POA: Diagnosis not present

## 2020-12-25 DIAGNOSIS — R079 Chest pain, unspecified: Secondary | ICD-10-CM | POA: Diagnosis not present

## 2020-12-25 DIAGNOSIS — Z01812 Encounter for preprocedural laboratory examination: Secondary | ICD-10-CM

## 2020-12-25 MED ORDER — DILTIAZEM HCL 60 MG PO TABS
ORAL_TABLET | ORAL | 0 refills | Status: DC
  Filled 2020-12-25 – 2021-01-02 (×2): qty 1, 1d supply, fill #0

## 2020-12-25 NOTE — Patient Instructions (Addendum)
Medication Instructions:  Your physician recommends that you continue on your current medications as directed. Please refer to the Current Medication list given to you today.  *If you need a refill on your cardiac medications before your next appointment, please call your pharmacy*   Lab Work: BMET, Lipid, A1C  If you have labs (blood work) drawn today and your tests are completely normal, you will receive your results only by: MyChart Message (if you have MyChart) OR A paper copy in the mail If you have any lab test that is abnormal or we need to change your treatment, we will call you to review the results.   Testing/Procedures: Coronary CTA-see instructions below  Your physician has requested that you have an echocardiogram. Echocardiography is a painless test that uses sound waves to create images of your heart. It provides your d octor with information about the size and shape of your heart and how well your heart's chambers and valves are working. This procedure takes approximately one hour. There are no restrictions for this procedure. This will be done at our [facility] location:  [address]  Follow-Up: At [facility], you and your health needs are our priority.  As part of our continuing mission to provide you with exceptional heart care, we have created designated Provider Care Teams.  These Care Teams include your primary Cardiologist (physician) and Advanced Practice Providers (APPs -  Physician Assistants and Nurse Practitioners) who all work together to provide you with the care you need, when you need it.  We recommend signing up for the patient portal called "MyChart".  Sign up information is provided on this After Visit Summary.  MyChart is used to connect with patients for Virtual Visits (Telemedicine).  Patients are able to view lab/test results, encounter notes, upcoming appointments, etc.  Non-urgent messages can be sent to your provider as  well.   To learn more about what you can do with MyChart, go to [email or web address].    Your next appointment:   6 month(s)  The format for your next appointment:   In Person  Provider:   Dr. Bjorn Pippin  Other Instructions   Your cardiac CT will be scheduled at one of the below locations:   [facility] [address] [place], [place] [address] [phone]  If scheduled at [facility], please arrive at the [facility] main entrance (entrance A) of [facility] 30 minutes prior to test start time. You can use the FREE valet parking offered at the main entrance (encouraged to control the heart rate for the test) Proceed to the [facility] Radiology Department (first floor) to check-in and test prep.  Please follow these instructions carefully (unless otherwise directed):  On the Night Before the Test: Be sure to Drink plenty of water. Do not consume any caffeinated/decaffeinated beverages or chocolate 12 hours prior to your test. Do not take any antihistamines 12 hours prior to your test.  On the Day of the Test: Drink plenty of water until 1 hour prior to the test. Do not eat any food 4 hours prior to the test. You may take your regular medications prior to the test.  Take diltiazem 60 mg TWO hours prior FEMALES- please wear underwire-free bra if available, avoid dresses & tight clothing      After the Test: Drink plenty of water. After receiving IV contrast, you may experience a mild flushed feeling. This is normal. On occasion, you may experience  a mild rash up to 24 hours after the test. This is not dangerous. If this occurs, you can take Benadryl 25 mg and increase your fluid intake. If you experience trouble breathing, this can be serious. If it is severe call 911 IMMEDIATELY. If it is mild, please call our office. If you take any of these medications: Glipizide/Metformin, Avandament, Glucavance, please do not take 48 hours after  completing test unless otherwise instructed.  Please allow 2-4 weeks for scheduling of routine cardiac CTs. Some insurance companies require a pre-authorization which may delay scheduling of this test.   For non-scheduling related questions, please contact the cardiac imaging nurse navigator should you have any questions/concerns: Rockwell Alexandria, Cardiac Imaging Nurse Navigator Larey Brick, Cardiac Imaging Nurse Navigator [facility] Direct Office Dial: [phone]   For scheduling needs, including cancellations and rescheduling, please call [place], [phone].

## 2021-01-02 ENCOUNTER — Other Ambulatory Visit (HOSPITAL_COMMUNITY): Payer: Self-pay

## 2021-01-02 DIAGNOSIS — Z13220 Encounter for screening for lipoid disorders: Secondary | ICD-10-CM | POA: Diagnosis not present

## 2021-01-02 DIAGNOSIS — R079 Chest pain, unspecified: Secondary | ICD-10-CM | POA: Diagnosis not present

## 2021-01-02 DIAGNOSIS — Z01812 Encounter for preprocedural laboratory examination: Secondary | ICD-10-CM | POA: Diagnosis not present

## 2021-01-02 DIAGNOSIS — Z131 Encounter for screening for diabetes mellitus: Secondary | ICD-10-CM | POA: Diagnosis not present

## 2021-01-03 LAB — BASIC METABOLIC PANEL
BUN/Creatinine Ratio: 14 (ref 9–23)
BUN: 11 mg/dL (ref 6–24)
CO2: 22 mmol/L (ref 20–29)
Calcium: 9.5 mg/dL (ref 8.7–10.2)
Chloride: 101 mmol/L (ref 96–106)
Creatinine, Ser: 0.77 mg/dL (ref 0.57–1.00)
Glucose: 116 mg/dL — ABNORMAL HIGH (ref 70–99)
Potassium: 5 mmol/L (ref 3.5–5.2)
Sodium: 137 mmol/L (ref 134–144)
eGFR: 91 mL/min/{1.73_m2} (ref >59)

## 2021-01-03 LAB — LIPID PANEL
Chol/HDL Ratio: 3.7 ratio (ref 0.0–4.4)
Cholesterol, Total: 189 mg/dL (ref 100–199)
HDL: 51 mg/dL (ref >39)
LDL Chol Calc (NIH): 115 mg/dL — ABNORMAL HIGH (ref 0–99)
Triglycerides: 129 mg/dL (ref 0–149)
VLDL Cholesterol Cal: 23 mg/dL (ref 5–40)

## 2021-01-03 LAB — HEMOGLOBIN A1C
Est. average glucose Bld gHb Est-mCnc: 103 mg/dL
Hgb A1c MFr Bld: 5.2 % (ref 4.8–5.6)

## 2021-01-08 ENCOUNTER — Ambulatory Visit (HOSPITAL_COMMUNITY): Payer: 59 | Attending: Internal Medicine

## 2021-01-08 ENCOUNTER — Other Ambulatory Visit: Payer: Self-pay

## 2021-01-08 DIAGNOSIS — R079 Chest pain, unspecified: Secondary | ICD-10-CM

## 2021-01-08 LAB — ECHOCARDIOGRAM COMPLETE
Area-P 1/2: 3.72 cm2
S' Lateral: 2.9 cm

## 2021-01-09 ENCOUNTER — Telehealth (HOSPITAL_COMMUNITY): Payer: Self-pay | Admitting: *Deleted

## 2021-01-09 NOTE — Telephone Encounter (Signed)
Reaching out to patient to offer assistance regarding upcoming cardiac imaging study; pt verbalizes understanding of appt date/time, parking situation and where to check in, pre-test NPO status and medications ordered, and verified current allergies; name and call back number provided for further questions should they arise  Larey Brick RN Navigator Cardiac Imaging [facility] [phone] office [phone] cell  Patient to take 60mg  cardizem two hours prior to cardiac CT scan.  She is aware to arrive at 4pm for her 4:30pm scan.

## 2021-01-11 ENCOUNTER — Other Ambulatory Visit: Payer: Self-pay

## 2021-01-11 ENCOUNTER — Ambulatory Visit (HOSPITAL_COMMUNITY)
Admission: RE | Admit: 2021-01-11 | Discharge: 2021-01-11 | Disposition: A | Discharge status: Home / Self Care | Payer: 59 | Source: Ambulatory Visit | Attending: Cardiology | Admitting: Cardiology

## 2021-01-11 DIAGNOSIS — R079 Chest pain, unspecified: Secondary | ICD-10-CM | POA: Insufficient documentation

## 2021-01-11 MED ORDER — DILTIAZEM HCL 25 MG/5ML IV SOLN: 5.0000 mg | Freq: Once | INTRAVENOUS | Status: DC

## 2021-01-11 MED ORDER — NITROGLYCERIN 0.4 MG SL SUBL
0.8000 mg | SUBLINGUAL_TABLET | Freq: Once | SUBLINGUAL | Status: AC
  Administered 2021-01-11: 0.8 mg via SUBLINGUAL

## 2021-01-11 MED ORDER — NITROGLYCERIN 0.4 MG SL SUBL
SUBLINGUAL_TABLET | SUBLINGUAL | Status: AC
  Filled 2021-01-11: qty 2

## 2021-01-11 MED ORDER — DILTIAZEM HCL 25 MG/5ML IV SOLN
INTRAVENOUS | Status: AC
  Filled 2021-01-11: qty 5

## 2021-01-11 MED ORDER — IOHEXOL 350 MG/ML SOLN
100.0000 mL | Freq: Once | INTRAVENOUS | Status: AC | PRN
  Administered 2021-01-11: 100 mL via INTRAVENOUS

## 2021-04-22 ENCOUNTER — Other Ambulatory Visit (HOSPITAL_BASED_OUTPATIENT_CLINIC_OR_DEPARTMENT_OTHER): Payer: Self-pay

## 2021-06-12 ENCOUNTER — Encounter (HOSPITAL_COMMUNITY): Payer: Self-pay

## 2021-06-12 ENCOUNTER — Ambulatory Visit (HOSPITAL_COMMUNITY)
Admission: EM | Admit: 2021-06-12 | Discharge: 2021-06-12 | Disposition: A | Discharge status: Home / Self Care | Payer: 59 | Attending: Family Medicine | Admitting: Family Medicine

## 2021-06-12 DIAGNOSIS — S29011A Strain of muscle and tendon of front wall of thorax, initial encounter: Secondary | ICD-10-CM | POA: Diagnosis not present

## 2021-06-12 MED ORDER — NAPROXEN 500 MG PO TABS: 500.0000 mg | ORAL_TABLET | Freq: Two times a day (BID) | ORAL | 0 refills | Status: DC | PRN

## 2021-06-12 NOTE — Discharge Instructions (Signed)
As we discussed, your symptoms seem most consistent with a muscle strain.  I have sent a prescription to the pharmacy for an anti-inflammatory medicine.  Do not take Goody powder, ibuprofen, Advil, Aleve while you are taking this.  You can use lidocaine patches that you can get at the pharmacy, you can also use heat or ice on the area.  If this pain severely worsens, especially if it moves more anterior chest, you develop difficulty breathing, fever, cough, you are sweating excessively with the pain, you should be seen at the emergency room right away. ?

## 2021-06-12 NOTE — ED Triage Notes (Signed)
Onset last night of left sided rib pain. Pt notes her physical activity has increased in the last few days due to yard work. No falls or injuries. Confirms pain with ROM. ?Pain is interfering with her sleep.  ?Has been taking goody powder. ?

## 2021-06-12 NOTE — ED Provider Notes (Signed)
?[facility] ? ? ? ?CSN: [id number] ?Arrival date & time: 06/12/21  1045 ? ? ?  ? ?History   ?Chief Complaint ?Chief Complaint  ?Patient presents with  ? Rib Injury  ?  left  ? ? ?HPI ?Kristen Brewer is a 56 y.o. female.  ? ?Left-sided rib pain ?Woke up in the middle the night last night with severe pain in her left ribs ?No particular injury, but has been doing a lot of yard work the last few days ?States it only hurts when she moves in certain directions ?No pain on exertion ?Does have worsening if she takes a very deep breath, but worse with movement and palpation ?Denies any difficulty breathing, cough, fevers, chest pain, diaphoresis ?States that she was recently worked up for chest pain by cardiology and that everything was normal ? ? ? ?Past Medical History:  ?Diagnosis Date  ? Allergy   ? Arthritis   ? Asthma   ? Osteoporosis 06/22/2004  ? ? ?Patient Active Problem List  ? Diagnosis Date Noted  ? Conjunctivitis 08/05/2018  ? Right wrist pain 03/30/2018  ? Osteoporosis 05/29/2017  ? Cigarette smoker 08/19/2014  ? Palpitations 06/28/2014  ? Abnormal cardiac sounds 06/28/2014  ? Abnormal EKG 06/28/2014  ? Ankle pain, right 12/04/2011  ? FINGER PAIN 01/03/2008  ? ? ?Past Surgical History:  ?Procedure Laterality Date  ? TRANSTHORACIC ECHOCARDIOGRAM  06/30/2014  ? Left ventricle: The cavity size was normal. Wall thickness was normal. Systolic function was normal. The EF was in the range of 55% to 60%. Left ventricular diastolic function parameters were normal.  ? wisdom teeth    ? ? ?OB History   ?No obstetric history on file. ?  ? ? ? ?Home Medications   ? ?Prior to Admission medications   ?Medication Sig Start Date End Date Taking? Authorizing Provider  ?naproxen (NAPROSYN) 500 MG tablet Take 1 tablet (500 mg total) by mouth 2 (two) times daily as needed (pain). 06/12/21  Yes Riata Ikeda, Bernita Raisin, DO  ?albuterol (VENTOLIN HFA) 108 (90 Base) MCG/ACT inhaler Inhale 1-2 puffs into the lungs every 4 (four)  hours as needed for wheezing or shortness of breath. 12/04/19   Dorie Rank, MD  ?Aspirin-Acetaminophen-Caffeine (EXCEDRIN PO) Take 1 tablet by mouth as needed.    [provider]  ?diltiazem (CARDIZEM) 60 MG tablet Take 1 tablet (60 mg) two hours prior to CT scan 12/25/20   Donato Heinz, MD  ?diphenhydrAMINE (BENADRYL) 25 MG tablet Take 25 mg by mouth daily as needed for allergies.    [provider]  ?Multiple Vitamin (MULTIVITAMIN) tablet Take 1 tablet by mouth daily.    [provider]  ?predniSONE (DELTASONE) 50 MG tablet Take 1 tablet (50 mg total) by mouth daily. ?Patient not taking: Reported on 12/25/2020 12/04/19   Dorie Rank, MD  ? ? ?Family History ?Family History  ?Problem Relation Age of Onset  ? Cancer Father   ?     Lung Cancer - metastasis to brain and spine  ? Asthma Daughter   ? Diabetes Maternal Grandmother   ? Kidney disease Maternal Grandmother   ?     ESRD due to DM  ? Heart failure Maternal Grandmother   ?     CHF  ? Cancer Maternal Grandfather   ?     melanoma  ? Dementia Maternal Grandfather 84  ? Heart disease Paternal Grandfather   ? Heart attack Paternal Grandfather 65  ? Hyperlipidemia Mother   ?  Alzheimer's disease Paternal Grandmother 53  ? ? ?Social History ?Social History  ? ?Tobacco Use  ? Smoking status: Every Day  ?  Packs/day: 1.00  ?  Years: 35.00  ?  Pack years: 35.00  ?  Types: Cigarettes  ? Smokeless tobacco: Never  ?Vaping Use  ? Vaping Use: Never used  ?Substance Use Topics  ? Alcohol use: Yes  ?  Alcohol/week: 0.0 standard drinks  ?  Comment: ~0.5 drink per year  ? Drug use: No  ? ? ? ?Allergies   ?Chantix [varenicline], Codeine, Mucinex [guaifenesin er], and Wellbutrin [bupropion] ? ? ?Review of Systems ?Review of Systems  ?All other systems reviewed and are negative. ? ?Per HPI ?Physical Exam ?Triage Vital Signs ?ED Triage Vitals [06/12/21 1233]  ?Enc Vitals Group  ?   BP 117/70  ?   Pulse Rate 79  ?   Resp 18  ?   Temp 97.6 ?F (36.4  ?C)  ?   Temp Source Oral  ?   SpO2 100 %  ?   Weight   ?   Height   ?   Head Circumference   ?   Peak Flow   ?   Pain Score   ?   Pain Loc   ?   Pain Edu?   ?   Excl. in [place]?   ? ?No data found. ? ?Updated Vital Signs ?BP 117/70 (BP Location: Left Arm)   Pulse 79   Temp 97.6 ?F (36.4 ?C) (Oral)   Resp 18   SpO2 100%  ? ?Visual Acuity ?Right Eye Distance:   ?Left Eye Distance:   ?Bilateral Distance:   ? ?Right Eye Near:   ?Left Eye Near:    ?Bilateral Near:    ? ?Physical Exam ?Constitutional:   ?   General: She is not in acute distress. ?   Appearance: Normal appearance. She is not ill-appearing.  ?HENT:  ?   Head: Normocephalic and atraumatic.  ?Eyes:  ?   Conjunctiva/sclera: Conjunctivae normal.  ?Cardiovascular:  ?   Rate and Rhythm: Normal rate and regular rhythm.  ?   Heart sounds: No murmur heard. ?Pulmonary:  ?   Effort: Pulmonary effort is normal. No respiratory distress.  ?   Breath sounds: Normal breath sounds. No wheezing, rhonchi or rales.  ?Musculoskeletal:  ?   Cervical back: Normal range of motion.  ?   Comments: Left Chest Wall ?Inspection: No deformities on visualization, no erythema, rashes, bruising ?Palpation: There is tenderness to palpation to the lower third of her ribs from about mid axillary line to nipple line.  There is no tenderness palpation of her thoracic spine. ?ROM: Full range of motion of the thoracic spine, occasionally has some sharp pains that shoot into her ribs when she moves her thoracic spine in various ways ?Neurovascular:NV intact distally ? ?  ?Skin: ?   General: Skin is warm and dry.  ?Neurological:  ?   Mental Status: She is alert and oriented to person, place, and time.  ?Psychiatric:     ?   Mood and Affect: Mood normal.     ?   Behavior: Behavior normal.  ? ? ? ?UC Treatments / Results  ?Labs ?(all labs ordered are listed, but only abnormal results are displayed) ?Labs Reviewed - No data to display ? ?EKG ? ? ?Radiology ?No results found. ? ?Procedures ?Procedures  (including critical care time) ? ?Medications Ordered in UC ?Medications - No data to display ? ?  Initial Impression / Assessment and Plan / UC Course  ?I have reviewed the triage vital signs and the nursing notes. ? ?Pertinent labs & imaging results that were available during my care of the patient were reviewed by me and considered in my medical decision making (see chart for details). ? ?  ? ?Left-sided lower rib pain secondary to intercostal muscle strain.  Very atypical for cardiac etiology, can hold off on further work-up for this and given return precautions for this.  Wells score of 0.  Discussed possibility of x-ray, although fracture would be very unlikely given no blunt trauma.  She declines an x-ray today.  Prescription sent for naproxen and advised to use topical lidocaine patches, heat, ice.  Recommend follow-up if severity increases, develops more chest pain, difficulty breathing, fever, diaphoresis, change in character. ? ? ?Final Clinical Impressions(s) / UC Diagnoses  ? ?Final diagnoses:  ?Intercostal muscle strain, initial encounter  ? ? ? ?Discharge Instructions   ? ?  ?As we discussed, your symptoms seem most consistent with a muscle strain.  I have sent a prescription to the pharmacy for an anti-inflammatory medicine.  Do not take Goody powder, ibuprofen, Advil, Aleve while you are taking this.  You can use lidocaine patches that you can get at the pharmacy, you can also use heat or ice on the area.  If this pain severely worsens, especially if it moves more anterior chest, you develop difficulty breathing, fever, cough, you are sweating excessively with the pain, you should be seen at the emergency room right away. ? ? ? ? ?ED Prescriptions   ? ? Medication Sig Dispense Auth. Provider  ? naproxen (NAPROSYN) 500 MG tablet Take 1 tablet (500 mg total) by mouth 2 (two) times daily as needed (pain). 30 tablet Danilyn Cocke, Bernita Raisin, DO  ? ?  ? ?PDMP not reviewed this encounter. ?  ?Cleophas Dunker, DO ?06/12/21 1250 ? ?

## 2022-06-07 ENCOUNTER — Ambulatory Visit
Admission: EM | Admit: 2022-06-07 | Discharge: 2022-06-07 | Disposition: A | Discharge status: Home / Self Care | Payer: Commercial Managed Care - PPO | Attending: Internal Medicine | Admitting: Internal Medicine

## 2022-06-07 DIAGNOSIS — R0789 Other chest pain: Secondary | ICD-10-CM | POA: Diagnosis not present

## 2022-06-07 MED ORDER — METHOCARBAMOL 500 MG PO TABS: 500.0000 mg | ORAL_TABLET | Freq: Every evening | ORAL | 0 refills | Status: DC | PRN

## 2022-06-07 MED ORDER — IBUPROFEN 600 MG PO TABS: 600.0000 mg | ORAL_TABLET | Freq: Four times a day (QID) | ORAL | 0 refills | Status: DC | PRN

## 2022-06-07 MED ORDER — KETOROLAC TROMETHAMINE 30 MG/ML IJ SOLN
30.0000 mg | Freq: Once | INTRAMUSCULAR | Status: AC
  Administered 2022-06-07: 30 mg via INTRAMUSCULAR

## 2022-06-07 NOTE — Discharge Instructions (Addendum)
The pain is musculoskeletal. Apply warm compresses intermittently as needed.   As pain recedes, begin normal activities slowly as tolerated.   Please take medications as prescribed Please do not drive or operate heavy machinery after taking muscle relaxants because they will make you drowsy Please return to urgent care if symptoms persist or worsens.

## 2022-06-07 NOTE — ED Triage Notes (Signed)
Pt presents with c/o rib pain that radiates to the r side of her back. States the r side of her neck is swollen. Pt states she took a goody powder at 4.

## 2022-06-08 NOTE — ED Provider Notes (Signed)
[facility]    CSN: [id number] Arrival date & time: 06/07/22  1512      History   Chief Complaint Chief Complaint  Patient presents with   Chest Pain    RIB      HPI Kristen Brewer is a 57 y.o. female comes to the urgent care with 1 day history of right-sided chest wall pain and right lower back pain of 1 day duration.  Pain started this morning after patient woke up.  She sleeps in the recliner and she was in the recliner with her cats the whole night.  No falls or trauma.  Patient denies any shortness of breath or wheezing.  No calf pain.  No long distance travel.  Pain is of moderate severity in both areas, sharp with no radiation.  Pain is aggravated by movement and palpation.  No known relieving factors.  Pain on the right side of the chest radiates to the back and the pain in the lower back does not radiate.  No cough or sputum production.   HPI  Past Medical History:  Diagnosis Date   Allergy    Arthritis    Asthma    Osteoporosis 06/22/2004    Patient Active Problem List   Diagnosis Date Noted   Conjunctivitis 08/05/2018   Right wrist pain 03/30/2018   Osteoporosis 05/29/2017   Cigarette smoker 08/19/2014   Palpitations 06/28/2014   Abnormal cardiac sounds 06/28/2014   Abnormal EKG 06/28/2014   Ankle pain, right 12/04/2011   FINGER PAIN 01/03/2008    Past Surgical History:  Procedure Laterality Date   TRANSTHORACIC ECHOCARDIOGRAM  06/30/2014   Left ventricle: The cavity size was normal. Wall thickness was normal. Systolic function was normal. The EF was in the range of 55% to 60%. Left ventricular diastolic function parameters were normal.   wisdom teeth      OB History   No obstetric history on file.      Home Medications    Prior to Admission medications   Medication Sig Start Date End Date Taking? Authorizing Provider  ibuprofen (ADVIL) 600 MG tablet Take 1 tablet (600 mg total) by mouth every 6 (six) hours as needed. 06/07/22  Yes  Roselia Snipe, Britta Mccreedy, MD  methocarbamol (ROBAXIN) 500 MG tablet Take 1 tablet (500 mg total) by mouth at bedtime as needed for muscle spasms. 06/07/22  Yes Radin Raptis, Britta Mccreedy, MD  albuterol (VENTOLIN HFA) 108 (90 Base) MCG/ACT inhaler Inhale 1-2 puffs into the lungs every 4 (four) hours as needed for wheezing or shortness of breath. 12/04/19   Linwood Dibbles, MD  Aspirin-Acetaminophen-Caffeine (EXCEDRIN PO) Take 1 tablet by mouth as needed.    [provider]  diltiazem (CARDIZEM) 60 MG tablet Take 1 tablet (60 mg) two hours prior to CT scan 12/25/20   Little Ishikawa, MD  diphenhydrAMINE (BENADRYL) 25 MG tablet Take 25 mg by mouth daily as needed for allergies.    [provider]  Multiple Vitamin (MULTIVITAMIN) tablet Take 1 tablet by mouth daily.    [provider]  predniSONE (DELTASONE) 50 MG tablet Take 1 tablet (50 mg total) by mouth daily. Patient not taking: Reported on 12/25/2020 12/04/19   Linwood Dibbles, MD    Family History Family History  Problem Relation Age of Onset   Cancer Father        Lung Cancer - metastasis to brain and spine   Asthma Daughter    Diabetes Maternal Grandmother    Kidney disease  Maternal Grandmother        ESRD due to DM   Heart failure Maternal Grandmother        CHF   Cancer Maternal Grandfather        melanoma   Dementia Maternal Grandfather 26   Heart disease Paternal Grandfather    Heart attack Paternal Grandfather 40   Hyperlipidemia Mother    Alzheimer's disease Paternal Grandmother 64    Social History Social History   Tobacco Use   Smoking status: Every Day    Packs/day: 1.00    Years: 35.00    Additional pack years: 0.00    Total pack years: 35.00    Types: Cigarettes   Smokeless tobacco: Never  Vaping Use   Vaping Use: Never used  Substance Use Topics   Alcohol use: Yes    Alcohol/week: 0.0 standard drinks of alcohol    Comment: ~0.5 drink per year   Drug use: No     Allergies   Chantix  [varenicline], Codeine, Mucinex [guaifenesin er], and Wellbutrin [bupropion]   Review of Systems Review of Systems As per HPI  Physical Exam Triage Vital Signs ED Triage Vitals  Enc Vitals Group     BP 06/07/22 1520 108/70     Pulse Rate 06/07/22 1520 83     Resp 06/07/22 1520 15     Temp 06/07/22 1520 98.1 F (36.7 C)     Temp Source 06/07/22 1520 Oral     SpO2 06/07/22 1520 94 %     Weight --      Height --      Head Circumference --      Peak Flow --      Pain Score 06/07/22 1524 6     Pain Loc --      Pain Edu? --      Excl. in [place]? --    No data found.  Updated Vital Signs BP 108/70 (BP Location: Right Arm)   Pulse 83   Temp 98.1 F (36.7 C) (Oral)   Resp 15   SpO2 94%   Visual Acuity Right Eye Distance:   Left Eye Distance:   Bilateral Distance:    Right Eye Near:   Left Eye Near:    Bilateral Near:     Physical Exam Vitals and nursing note reviewed.  Constitutional:      General: She is not in acute distress.    Appearance: She is not ill-appearing.  Cardiovascular:     Rate and Rhythm: Normal rate and regular rhythm.     Heart sounds: Normal heart sounds.  Pulmonary:     Effort: Pulmonary effort is normal.     Breath sounds: Normal breath sounds.  Chest:     Comments: Point tenderness over the costal cartilage of the sixth and seventh rib.  Point tenderness over the right sacroiliac joint Abdominal:     Palpations: Abdomen is soft.  Musculoskeletal:     Comments: No calf tenderness.  Skin:    General: Skin is warm.  Neurological:     Mental Status: She is alert.      UC Treatments / Results  Labs (all labs ordered are listed, but only abnormal results are displayed) Labs Reviewed - No data to display  EKG   Radiology No results found.  Procedures Procedures (including critical care time)  Medications Ordered in UC Medications  ketorolac (TORADOL) 30 MG/ML injection 30 mg (30 mg Intramuscular Given 06/07/22 1549)     Initial  Impression / Assessment and Plan / UC Course  I have reviewed the triage vital signs and the nursing notes.  Pertinent labs & imaging results that were available during my care of the patient were reviewed by me and considered in my medical decision making (see chart for details).     1.  Musculoskeletal chest wall pain: 2.  Right sacroiliac joint pain Toradol 30 mg IM x 1 dose Ibuprofen 600 mg every 6 hours as needed for pain Robaxin 500 mg at bedtime as needed for muscle stiffness or muscle spasms Heating pad use only 20 minutes on-20 minutes of cycle Medication precautions given Return precautions given. Final Clinical Impressions(s) / UC Diagnoses   Final diagnoses:  Acute chest wall pain     Discharge Instructions      The pain is musculoskeletal. Apply warm compresses intermittently as needed.   As pain recedes, begin normal activities slowly as tolerated.   Please take medications as prescribed Please do not drive or operate heavy machinery after taking muscle relaxants because they will make you drowsy Please return to urgent care if symptoms persist or worsens.    ED Prescriptions     Medication Sig Dispense Auth. Provider   ibuprofen (ADVIL) 600 MG tablet Take 1 tablet (600 mg total) by mouth every 6 (six) hours as needed. 30 tablet Alli Jasmer, Britta Mccreedy, MD   methocarbamol (ROBAXIN) 500 MG tablet Take 1 tablet (500 mg total) by mouth at bedtime as needed for muscle spasms. 10 tablet Saddie Sandeen, Britta Mccreedy, MD      PDMP not reviewed this encounter.   Merrilee Jansky, MD 06/08/22 [phone]

## 2022-08-09 ENCOUNTER — Other Ambulatory Visit (HOSPITAL_BASED_OUTPATIENT_CLINIC_OR_DEPARTMENT_OTHER): Payer: Self-pay

## 2022-09-06 IMAGING — CT CT HEART MORP W/ CTA COR W/ SCORE W/ CA W/CM &/OR W/O CM
4 of 7 series · 8 of 20 positions shown, 9 images · IV contrast (APPLIED)
Comparison: None.

Addendum:
CLINICAL DATA: This is a 55 year old female with anginal symptoms.

EXAM:
Cardiac/Coronary  CTA
TECHNIQUE: The patient was scanned on a [employer] scanner.

[Series 6: best diast 77 % · axial · 0.36mm/px · z∈[+1168,+1215]mm · 2 of 351 slices shown]
[im 117/351  vessel]
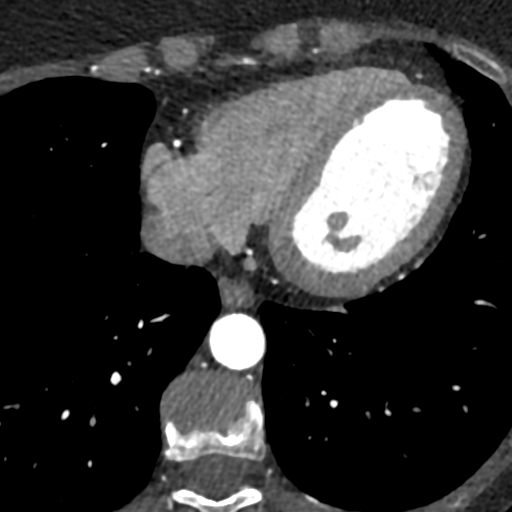
[im 234/351  vessel]
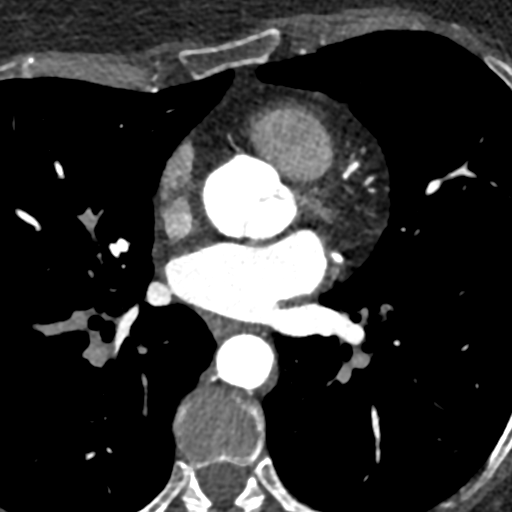

[Series 10: ts syst · axial · 0.36mm/px · z∈[+1168,+1215]mm · 2 of 351 slices shown, 3 images]
[im 117/351  vessel]
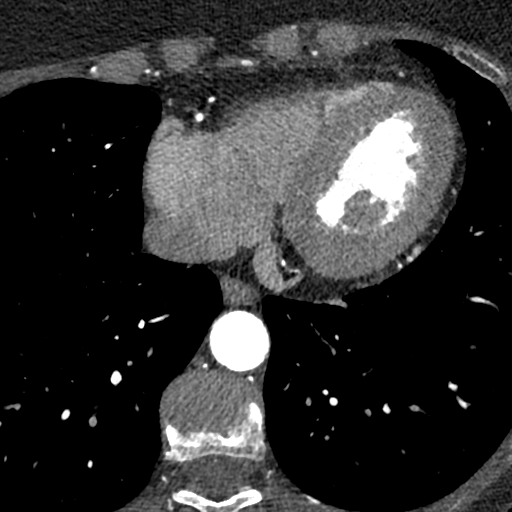
[im 117/351  lung]
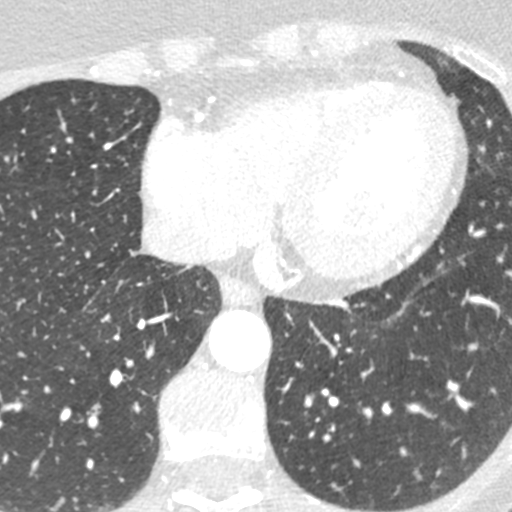
[im 234/351  vessel]
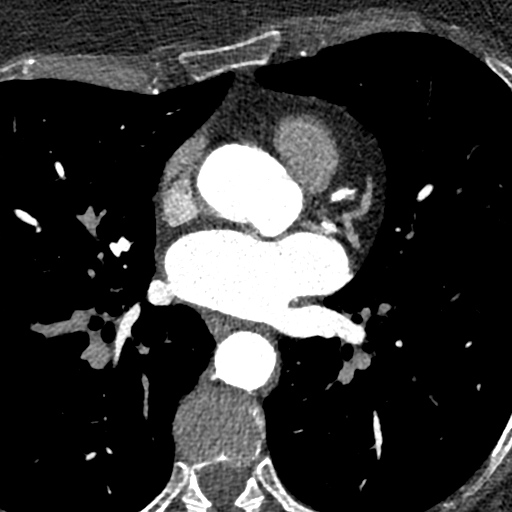

[Series 11: best syst · axial · 0.36mm/px · z∈[+1168,+1215]mm · 2 of 351 slices shown]
[im 117/351  vessel]
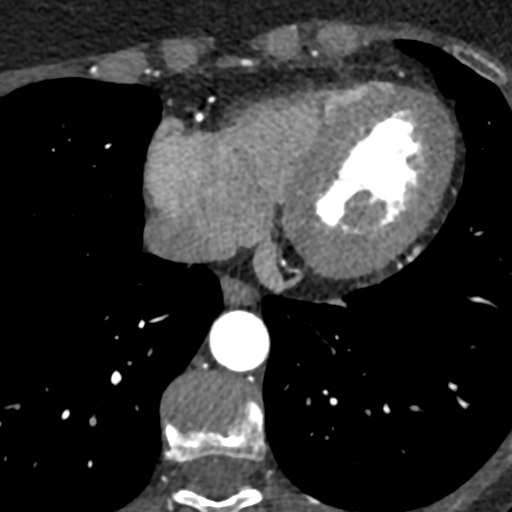
[im 234/351  vessel]
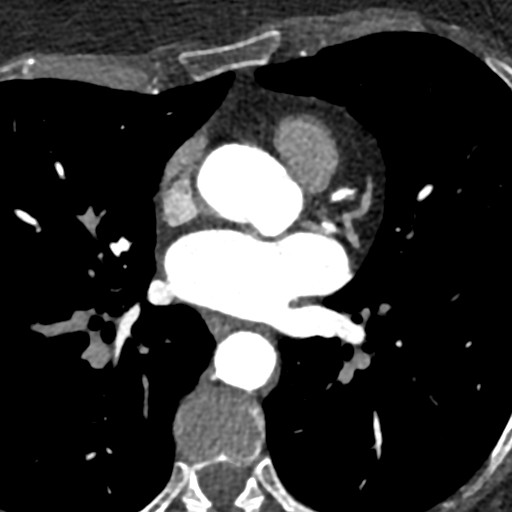

[Series 12: ts diast · axial · 0.36mm/px · z∈[+1168,+1215]mm · 2 of 351 slices shown]
[im 117/351  vessel]
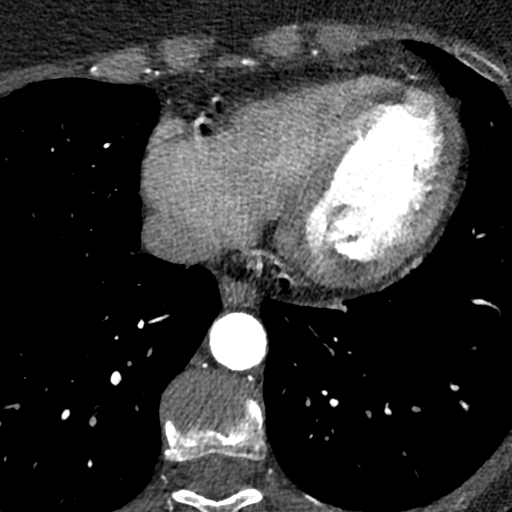
[im 234/351  vessel]
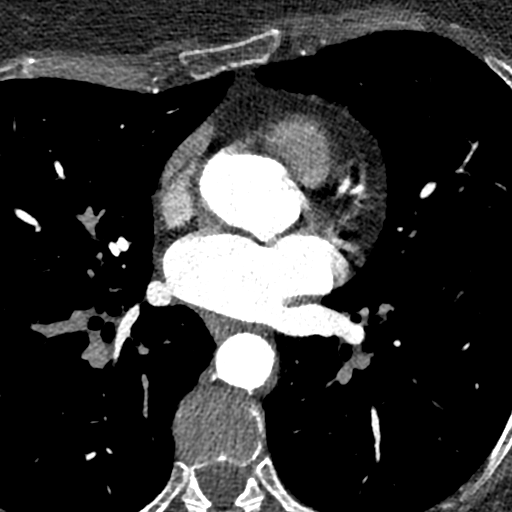

[8 of 20 positions shown; findings below may reference images not displayed]



Aorta: Normal size.  No calcifications.  No dissection.

Aortic Valve:  Trileaflet.  No calcifications.

Coronary Arteries:  Normal coronary origin.  Right dominance.

RCA is a large dominant artery that gives rise to PDA and PLA. There
is no plaque.

Left main is a large artery that gives rise to LAD and LCX arteries.

LAD is a large vessel that has no plaque.

LCX is a non-dominant artery that gives rise to one large OM1
branch. There is no plaque.

Coronary Calcium Score:

Left main: 0

Left anterior descending artery: 0

Left circumflex artery: 0

Right coronary artery: 0

Total: 0

Percentile: 0

Other findings:

Normal pulmonary vein drainage into the left atrium.

Normal left atrial appendage without a thrombus.

Normal size of the pulmonary artery.
IMPRESSION: 1. Coronary calcium score of 0. This was 0 percentile for age and
sex matched control.

2. Normal coronary origin with right dominance.

3. No evidence of CAD. CAD-RADS 0. No evidence of CAD (0%). Consider
non-atherosclerotic causes of chest pain.

EXAM:
OVER-READ INTERPRETATION  CT CHEST

The following report is an over-read performed by radiologist Dr.
does not include interpretation of cardiac or coronary anatomy or
pathology. The coronary calcium score/coronary CTA interpretation by
the cardiologist is attached.
FINDINGS: No suspicious nodules, masses, or infiltrates are identified in the
visualized portion of the lungs. No pleural fluid seen.

The visualized portions of the mediastinum and chest wall are
unremarkable.
IMPRESSION: No significant non-cardiac abnormality in visualized portion of the
thorax.

*** End of Addendum ***
FINDINGS: A 100 kV prospective scan was triggered in the descending thoracic
aorta at 111 HU's. Axial non-contrast 3 mm slices were carried out
through the heart. The data set was analyzed on a dedicated work
station and scored using the Agatson method. Gantry rotation speed
was 250 msecs and collimation was .6 mm. No beta blockade and 0.8 mg
of sl NTG was given. The 3D data set was reconstructed in 5%
intervals of the 67-82 % of the R-R cycle. Diastolic phases were
analyzed on a dedicated work station using MPR, MIP and VRT modes.
The patient received 80 cc of contrast.

Aorta: Normal size.  No calcifications.  No dissection.

Aortic Valve:  Trileaflet.  No calcifications.

Coronary Arteries:  Normal coronary origin.  Right dominance.

RCA is a large dominant artery that gives rise to PDA and PLA. There
is no plaque.

Left main is a large artery that gives rise to LAD and LCX arteries.

LAD is a large vessel that has no plaque.

LCX is a non-dominant artery that gives rise to one large OM1
branch. There is no plaque.

Coronary Calcium Score:

Left main: 0

Left anterior descending artery: 0

Left circumflex artery: 0

Right coronary artery: 0

Total: 0

Percentile: 0

Other findings:

Normal pulmonary vein drainage into the left atrium.

Normal left atrial appendage without a thrombus.

Normal size of the pulmonary artery.
IMPRESSION: 1. Coronary calcium score of 0. This was 0 percentile for age and
sex matched control.

2. Normal coronary origin with right dominance.

3. No evidence of CAD. CAD-RADS 0. No evidence of CAD (0%). Consider
non-atherosclerotic causes of chest pain.

## 2022-10-15 ENCOUNTER — Emergency Department (HOSPITAL_BASED_OUTPATIENT_CLINIC_OR_DEPARTMENT_OTHER): Payer: Commercial Managed Care - PPO

## 2022-10-15 ENCOUNTER — Other Ambulatory Visit: Payer: Self-pay

## 2022-10-15 ENCOUNTER — Emergency Department (HOSPITAL_BASED_OUTPATIENT_CLINIC_OR_DEPARTMENT_OTHER)
Admission: EM | Admit: 2022-10-15 | Discharge: 2022-10-15 | Disposition: A | Discharge status: Home / Self Care | Payer: Commercial Managed Care - PPO | Attending: Emergency Medicine | Admitting: Emergency Medicine

## 2022-10-15 ENCOUNTER — Encounter (HOSPITAL_BASED_OUTPATIENT_CLINIC_OR_DEPARTMENT_OTHER): Payer: Self-pay | Admitting: *Deleted

## 2022-10-15 DIAGNOSIS — R1013 Epigastric pain: Secondary | ICD-10-CM | POA: Diagnosis not present

## 2022-10-15 DIAGNOSIS — R1011 Right upper quadrant pain: Secondary | ICD-10-CM | POA: Diagnosis not present

## 2022-10-15 DIAGNOSIS — N83202 Unspecified ovarian cyst, left side: Secondary | ICD-10-CM | POA: Diagnosis not present

## 2022-10-15 DIAGNOSIS — K279 Peptic ulcer, site unspecified, unspecified as acute or chronic, without hemorrhage or perforation: Secondary | ICD-10-CM | POA: Diagnosis not present

## 2022-10-15 DIAGNOSIS — N83201 Unspecified ovarian cyst, right side: Secondary | ICD-10-CM | POA: Diagnosis not present

## 2022-10-15 DIAGNOSIS — R079 Chest pain, unspecified: Secondary | ICD-10-CM | POA: Diagnosis not present

## 2022-10-15 DIAGNOSIS — E278 Other specified disorders of adrenal gland: Secondary | ICD-10-CM | POA: Diagnosis not present

## 2022-10-15 DIAGNOSIS — R935 Abnormal findings on diagnostic imaging of other abdominal regions, including retroperitoneum: Secondary | ICD-10-CM | POA: Diagnosis not present

## 2022-10-15 DIAGNOSIS — R109 Unspecified abdominal pain: Secondary | ICD-10-CM | POA: Diagnosis not present

## 2022-10-15 DIAGNOSIS — I700 Atherosclerosis of aorta: Secondary | ICD-10-CM | POA: Diagnosis not present

## 2022-10-15 DIAGNOSIS — R918 Other nonspecific abnormal finding of lung field: Secondary | ICD-10-CM | POA: Diagnosis not present

## 2022-10-15 DIAGNOSIS — R9431 Abnormal electrocardiogram [ECG] [EKG]: Secondary | ICD-10-CM | POA: Diagnosis not present

## 2022-10-15 LAB — COMPREHENSIVE METABOLIC PANEL
ALT: 16 U/L (ref 0–44)
AST: 17 U/L (ref 15–41)
Albumin: 3.6 g/dL (ref 3.5–5.0)
Alkaline Phosphatase: 79 U/L (ref 38–126)
Anion gap: 8 (ref 5–15)
BUN: 13 mg/dL (ref 6–20)
CO2: 23 mmol/L (ref 22–32)
Calcium: 9.4 mg/dL (ref 8.9–10.3)
Chloride: 105 mmol/L (ref 98–111)
Creatinine, Ser: 0.73 mg/dL (ref 0.44–1.00)
GFR, Estimated: >60 mL/min (ref >60)
Glucose, Bld: 92 mg/dL (ref 70–99)
Potassium: 4.3 mmol/L (ref 3.5–5.1)
Sodium: 136 mmol/L (ref 135–145)
Total Bilirubin: 0.4 mg/dL (ref 0.3–1.2)
Total Protein: 7 g/dL (ref 6.5–8.1)

## 2022-10-15 LAB — CBC WITH DIFFERENTIAL/PLATELET
Abs Immature Granulocytes: 0.03 10*3/uL (ref 0.00–0.07)
Basophils Absolute: 0.1 10*3/uL (ref 0.0–0.1)
Basophils Relative: 1 %
Eosinophils Absolute: 0.2 10*3/uL (ref 0.0–0.5)
Eosinophils Relative: 2 %
HCT: 37.3 % (ref 36.0–46.0)
Hemoglobin: 12.8 g/dL (ref 12.0–15.0)
Immature Granulocytes: 0 %
Lymphocytes Relative: 24 %
Lymphs Abs: 2.4 10*3/uL (ref 0.7–4.0)
MCH: 32.4 pg (ref 26.0–34.0)
MCHC: 34.3 g/dL (ref 30.0–36.0)
MCV: 94.4 fL (ref 80.0–100.0)
Monocytes Absolute: 0.5 10*3/uL (ref 0.1–1.0)
Monocytes Relative: 5 %
Neutro Abs: 6.7 10*3/uL (ref 1.7–7.7)
Neutrophils Relative %: 68 %
Platelets: 294 10*3/uL (ref 150–400)
RBC: 3.95 MIL/uL (ref 3.87–5.11)
RDW: 11.8 % (ref 11.5–15.5)
WBC: 9.8 10*3/uL (ref 4.0–10.5)
nRBC: 0 % (ref 0.0–0.2)

## 2022-10-15 LAB — URINALYSIS, ROUTINE W REFLEX MICROSCOPIC
Bilirubin Urine: NEGATIVE
Glucose, UA: NEGATIVE mg/dL
Hgb urine dipstick: NEGATIVE
Ketones, ur: NEGATIVE mg/dL
Leukocytes,Ua: NEGATIVE
Nitrite: NEGATIVE
Protein, ur: NEGATIVE mg/dL
Specific Gravity, Urine: 1.015 (ref 1.005–1.030)
pH: 5.5 (ref 5.0–8.0)

## 2022-10-15 LAB — PREGNANCY, URINE: Preg Test, Ur: NEGATIVE

## 2022-10-15 LAB — LIPASE, BLOOD: Lipase: 39 U/L (ref 11–51)

## 2022-10-15 MED ORDER — IOHEXOL 350 MG/ML SOLN
100.0000 mL | Freq: Once | INTRAVENOUS | Status: AC | PRN
  Administered 2022-10-15: 100 mL via INTRAVENOUS

## 2022-10-15 MED ORDER — HYDROMORPHONE HCL 1 MG/ML IJ SOLN
0.5000 mg | Freq: Once | INTRAMUSCULAR | Status: AC
  Administered 2022-10-15: 0.5 mg via INTRAVENOUS
  Filled 2022-10-15: qty 1

## 2022-10-15 MED ORDER — ALUM & MAG HYDROXIDE-SIMETH 200-200-20 MG/5ML PO SUSP
30.0000 mL | Freq: Once | ORAL | Status: AC
  Administered 2022-10-15: 30 mL via ORAL
  Filled 2022-10-15: qty 30

## 2022-10-15 MED ORDER — FAMOTIDINE IN NACL 20-0.9 MG/50ML-% IV SOLN
20.0000 mg | Freq: Once | INTRAVENOUS | Status: AC
  Administered 2022-10-15: 20 mg via INTRAVENOUS
  Filled 2022-10-15: qty 50

## 2022-10-15 MED ORDER — FAMOTIDINE 20 MG PO TABS: 20.0000 mg | ORAL_TABLET | Freq: Two times a day (BID) | ORAL | 0 refills | Status: AC

## 2022-10-15 NOTE — Discharge Instructions (Addendum)
You were seen for your stomach pain in the emergency department.  It appears that you have a gastric ulcer.  At home, please take the Pepcid we have prescribed you for your ulcer.  Please avoid any Goody's powder alcohol because this can make your ulcer worse.    Check your MyChart online for the results of any tests that had not resulted by the time you left the emergency department.   Follow-up with your primary doctor in 2-3 days regarding your visit.  Please discuss your ovarian cyst and adrenal cyst with them to see if you need to have any follow-up imaging.  Follow-up with gastroenterology.  Return immediately to the emergency department if you experience any of the following: Severe pain, vomiting, or any other concerning symptoms.    Thank you for visiting our Emergency Department. It was a pleasure taking care of you today.

## 2022-10-15 NOTE — ED Provider Notes (Signed)
[place] [facility] Provider Note   CSN: [id number] Arrival date & time: 10/15/22  0831     History  Chief Complaint  Patient presents with   Abdominal Pain    Kristen Brewer is a 57 y.o. female.  57 year old female who presents to the emergency department with right upper quadrant pain.  Patient reports that she has intermittently been having right upper quadrant pain.  Says today at 3 AM it became severe.  Describes it as a sandpaperlike sensation in her right upper quadrant and epigastrium.  Tried eating some bread but did not change the pain.  Has been taking some Goody's powder for it as well.  Says that she now has a gas-like sensation and 7 out of 10 in severity.  Denies any fever, nausea or vomiting, diarrhea, chest pain, shortness of breath, cough, or worsening leg swelling.  No history of abdominal surgeries.  Not on blood thinners.       Home Medications Prior to Admission medications   Medication Sig Start Date End Date Taking? Authorizing Provider  famotidine (PEPCID) 20 MG tablet Take 1 tablet (20 mg total) by mouth 2 (two) times daily. 10/15/22  Yes Rondel Baton, MD  albuterol (VENTOLIN HFA) 108 (90 Base) MCG/ACT inhaler Inhale 1-2 puffs into the lungs every 4 (four) hours as needed for wheezing or shortness of breath. 12/04/19   Linwood Dibbles, MD  Aspirin-Acetaminophen-Caffeine (EXCEDRIN PO) Take 1 tablet by mouth as needed.    [provider]  diltiazem (CARDIZEM) 60 MG tablet Take 1 tablet (60 mg) two hours prior to CT scan 12/25/20   Little Ishikawa, MD  diphenhydrAMINE (BENADRYL) 25 MG tablet Take 25 mg by mouth daily as needed for allergies.    [provider]  methocarbamol (ROBAXIN) 500 MG tablet Take 1 tablet (500 mg total) by mouth at bedtime as needed for muscle spasms. 06/07/22   Merrilee Jansky, MD  Multiple Vitamin (MULTIVITAMIN) tablet Take 1 tablet by mouth daily.    [provider]  predniSONE (DELTASONE) 50 MG tablet Take 1 tablet (50 mg total) by mouth daily. Patient not taking: Reported on 12/25/2020 12/04/19   Linwood Dibbles, MD      Allergies    Chantix [varenicline], Codeine, Mucinex [guaifenesin er], and Wellbutrin [bupropion]    Review of Systems   Review of Systems  Physical Exam Updated Vital Signs BP 128/84   Pulse 63   Temp (!) 97.5 F (36.4 C) (Oral)   Resp 18   Wt 63.5 kg   SpO2 100%   BMI 24.03 kg/m  Physical Exam Vitals and nursing note reviewed.  Constitutional:      General: She is not in acute distress.    Appearance: She is well-developed.  HENT:     Head: Normocephalic and atraumatic.     Right Ear: External ear normal.     Left Ear: External ear normal.     Nose: Nose normal.  Eyes:     Extraocular Movements: Extraocular movements intact.     Conjunctiva/sclera: Conjunctivae normal.     Pupils: Pupils are equal, round, and reactive to light.  Cardiovascular:     Rate and Rhythm: Normal rate and regular rhythm.     Heart sounds: No murmur heard.    Comments: No rashes the chest wall or abdomen Pulmonary:     Effort: Pulmonary effort is normal. No respiratory distress.     Breath sounds: Normal breath  sounds.  Abdominal:     General: Abdomen is flat. There is no distension.     Palpations: Abdomen is soft. There is no mass.     Tenderness: There is abdominal tenderness (Right upper quadrant and epigastrium). There is no guarding.  Musculoskeletal:     Cervical back: Normal range of motion and neck supple.  Skin:    General: Skin is warm and dry.  Neurological:     Mental Status: She is alert and oriented to person, place, and time. Mental status is at baseline.  Psychiatric:        Mood and Affect: Mood normal.     ED Results / Procedures / Treatments   Labs (all labs ordered are listed, but only abnormal results are displayed) Labs Reviewed  COMPREHENSIVE METABOLIC PANEL  LIPASE, BLOOD  CBC WITH  DIFFERENTIAL/PLATELET  URINALYSIS, ROUTINE W REFLEX MICROSCOPIC  PREGNANCY, URINE    EKG EKG Interpretation Date/Time:  Wednesday October 15 2022 09:28:32 EDT Ventricular Rate:  64 PR Interval:  147 QRS Duration:  82 QT Interval:  385 QTC Calculation: 398 R Axis:   55  Text Interpretation: Sinus rhythm Confirmed by Vonita Moss [phone]) on 10/15/2022 10:30:44 AM  Radiology CT ABDOMEN PELVIS W CONTRAST  Result Date: 10/15/2022 CLINICAL DATA:  Right-sided abdominal pain EXAM: CT ABDOMEN AND PELVIS WITH CONTRAST TECHNIQUE: Multidetector CT imaging of the abdomen and pelvis was performed using the standard protocol following bolus administration of intravenous contrast. RADIATION DOSE REDUCTION: This exam was performed according to the departmental dose-optimization program which includes automated exposure control, adjustment of the mA and/or kV according to patient size and/or use of iterative reconstruction technique. CONTRAST:  OMNIPAQUE IOHEXOL 350 MG/ML SOLN COMPARISON:  None Available. FINDINGS: Lower chest:  No contributory findings. Hepatobiliary: No focal liver abnormality.No evidence of biliary obstruction or stone. Pancreas: Unremarkable. Spleen: Unremarkable. Adrenals/Urinary Tract: Low-density thickening of the right adrenal gland measuring up to 14 mm in thickness. No change on the delayed phase detected. No hydronephrosis or stone. Unremarkable bladder. Stomach/Bowel: Low-density circumferential thickening of the proximal duodenum with mild adjacent nodal prominence. A discrete ulcer is not visualized. No perforation. No appendicitis. Vascular/Lymphatic: No acute vascular abnormality. Scattered atheromatous wall calcification. No mass or adenopathy. Reproductive:Left adnexal thickening, unexpected for age, with both solid density and cystic components, cystic area measuring up to 2.4 cm and the more solid area measuring 3.4 cm. Other: No ascites or pneumoperitoneum.  Musculoskeletal: No acute abnormalities. IMPRESSION: 1. Low-density thickening of the duodenum, suspect peptic ulcer disease. No perforation. 2. Solid and cystic density enlargement of the left ovary, unexpected for age. Recommend follow-up pelvic ultrasound. 3. Low-density right adrenal nodule, usually adenoma. If patient has history of cancer, recommend further evaluation with adrenal protocol abdomen CT without and with contrast. If no history of cancer, consider followup by CT in 12 months. Electronically Signed   By: Tiburcio Pea M.D.   On: 10/15/2022 11:21   CT Angio Chest PE W and/or Wo Contrast  Result Date: 10/15/2022 CLINICAL DATA:  Chest and abdominal pain, rule out pulmonary embolism EXAM: CT ANGIOGRAPHY CHEST WITH CONTRAST TECHNIQUE: Multidetector CT imaging of the chest was performed using the standard protocol during bolus administration of intravenous contrast. Multiplanar CT image reconstructions and MIPs were obtained to evaluate the vascular anatomy. RADIATION DOSE REDUCTION: This exam was performed according to the departmental dose-optimization program which includes automated exposure control, adjustment of the mA and/or kV according to patient size and/or use of iterative reconstruction  technique. CONTRAST:  OMNIPAQUE IOHEXOL 350 MG/ML SOLN COMPARISON:  None Available. FINDINGS: Cardiovascular: Satisfactory opacification of the pulmonary arteries to the segmental level. No evidence of pulmonary embolism. Normal heart size. No pericardial effusion. Scattered aortic atherosclerosis. Mediastinum/Nodes: No enlarged mediastinal, hilar, or axillary lymph nodes. Thyroid gland, trachea, and esophagus demonstrate no significant findings. Lungs/Pleura: Minimal bibasilar scarring or atelectasis. No pleural effusion or pneumothorax. Upper Abdomen: Please see separately reported examination of the abdomen and pelvis. Musculoskeletal: No chest wall abnormality. No acute osseous findings. Review  of the MIP images confirms the above findings. IMPRESSION: 1. Negative examination for pulmonary embolism. 2. Minimal bibasilar scarring or atelectasis. No acute abnormality of the lungs. Aortic Atherosclerosis (ICD10-I70.0). Electronically Signed   By: Jearld Lesch M.D.   On: 10/15/2022 11:18   US Abdomen Limited RUQ (LIVER/GB)  Result Date: 10/15/2022 CLINICAL DATA:  Right upper quadrant abdominal pain EXAM: ULTRASOUND ABDOMEN LIMITED RIGHT UPPER QUADRANT COMPARISON:  None Available. FINDINGS: Gallbladder: No gallstones or wall thickening visualized. No sonographic Murphy sign noted by sonographer. Common bile duct: Diameter: 2 mm Liver: No focal lesion identified. Within normal limits in parenchymal echogenicity. Portal vein is patent on color Doppler imaging with normal direction of blood flow towards the liver. Other: None. IMPRESSION: No gallstones or ductal dilatation. Electronically Signed   By: Karen Kays M.D.   On: 10/15/2022 09:47   DG Chest 2 View  Result Date: 10/15/2022 CLINICAL DATA:  Right-sided chest pain. EXAM: CHEST - 2 VIEW COMPARISON:  October 07, 2020. FINDINGS: The heart size and mediastinal contours are within normal limits. Both lungs are clear. The visualized skeletal structures are unremarkable. IMPRESSION: No active cardiopulmonary disease. Electronically Signed   By: Lupita Raider M.D.   On: 10/15/2022 09:43    Procedures Procedures    Medications Ordered in ED Medications  HYDROmorphone (DILAUDID) injection 0.5 mg (0.5 mg Intravenous Given 10/15/22 0934)  famotidine (PEPCID) IVPB 20 mg premix (0 mg Intravenous Stopped 10/15/22 1149)  alum & mag hydroxide-simeth (MAALOX/MYLANTA) 200-200-20 MG/5ML suspension 30 mL (30 mLs Oral Given 10/15/22 1112)  iohexol (OMNIPAQUE) 350 MG/ML injection 100 mL (100 mLs Intravenous Contrast Given 10/15/22 1056)    ED Course/ Medical Decision Making/ A&P Clinical Course as of 10/15/22 1608  Wed Oct 15, 2022  1137 CT ABDOMEN PELVIS  W CONTRAST CT with peptic ulcer disease, left ovary cyst, and adrenal nodule.   [RP]    Clinical Course User Index [RP] Rondel Baton, MD                                 Medical Decision Making Amount and/or Complexity of Data Reviewed Labs: ordered. Radiology: ordered. Decision-making details documented in ED Course.  Risk OTC drugs. Prescription drug management.   SHALYNN MANALAC is a 57 y.o. female who presents to the emergency department with epigastric and right upper quadrant abdominal pain  Initial Ddx:  Cholecystitis, pancreatitis, peptic ulcer disease/gastritis, pneumonia, PE, malignancy  MDM/Course:  Patient presents to the emergency department with epigastric abdominal pain and right upper quadrant abdominal pain.  Initially was concerned about gastritis, pancreatitis, and peptic ulcer disease.  Had a right upper quadrant ultrasound and was given pain medication.  Also had a chest x-ray.  Her right upper quadrant ultrasound and chest x-ray were unremarkable.  Is having persistent pain despite the above medications and a GI cocktail so a CT of the chest as  well as abdomen and pelvis were obtained.  No acute abnormalities takes bleeding the patient's symptoms.  Was informed of the incidental ovarian cyst and adrenal nodule which may require follow-up.  Given her reassuring workup here in the emergency department feel that she is suitable for outpatient follow-up with her primary doctor and GI.  Was counseled on alcohol cessation and to decrease her use of NSAIDs such as Goody powder.  Will start her on Pepcid at this time as well.  This patient presents to the ED for concern of complaints listed in HPI, this involves an extensive number of treatment options, and is a complaint that carries with it a high risk of complications and morbidity. Disposition including potential need for admission considered.   Dispo: DC Home. Return precautions discussed including, but not limited  to, those listed in the AVS. Allowed pt time to ask questions which were answered fully prior to dc.  Records reviewed Outpatient Clinic Notes and ED Visit Notes The following labs were independently interpreted: Chemistry and show no acute abnormality I independently reviewed the following imaging with scope of interpretation limited to determining acute life threatening conditions related to emergency care: Chest x-ray and agree with the radiologist interpretation with the following exceptions: none I personally reviewed and interpreted cardiac monitoring: normal sinus rhythm  I personally reviewed and interpreted the pt's EKG: see above for interpretation  I have reviewed the patients home medications and made adjustments as needed    Final Clinical Impression(s) / ED Diagnoses Final diagnoses:  Cyst of left ovary  Adrenal cyst (HCC)  Peptic ulcer disease    Rx / DC Orders ED Discharge Orders          Ordered    famotidine (PEPCID) 20 MG tablet  2 times daily        10/15/22 1138              Rondel Baton, MD 10/15/22 [phone]

## 2022-10-15 NOTE — ED Triage Notes (Signed)
Here by POV from home for abd pain, "from ribs to hips primarily on R, generalized". Pain worse with eating and drinking, not sure if r/t ulcer. Mentions gas. Mild TTP. Denies NVD, fever. Rates 7/10. Onset 3a. No relief with tums or goodies. Last ate "bread at 3a". Last BM this am (normal). Alert, NAD, calm, interactive.Steady gait.

## 2022-10-16 ENCOUNTER — Encounter: Payer: Self-pay | Admitting: Physician Assistant

## 2022-12-30 ENCOUNTER — Encounter: Payer: Self-pay | Admitting: Physician Assistant

## 2022-12-30 ENCOUNTER — Ambulatory Visit: Payer: Commercial Managed Care - PPO | Admitting: Physician Assistant

## 2022-12-30 VITALS — BP 110/62 | HR 67 | Ht 64.0 in | Wt 140.2 lb

## 2022-12-30 DIAGNOSIS — Z1211 Encounter for screening for malignant neoplasm of colon: Secondary | ICD-10-CM | POA: Diagnosis not present

## 2022-12-30 DIAGNOSIS — R935 Abnormal findings on diagnostic imaging of other abdominal regions, including retroperitoneum: Secondary | ICD-10-CM

## 2022-12-30 DIAGNOSIS — R1011 Right upper quadrant pain: Secondary | ICD-10-CM | POA: Diagnosis not present

## 2022-12-30 MED ORDER — PLENVU 140 G PO SOLR: 1.0000 | Freq: Once | ORAL | 0 refills | Status: AC

## 2022-12-30 NOTE — Progress Notes (Signed)
Chief Complaint: ER follow-up for peptic ulcer disease  HPI:    Kristen Brewer is a 57 year old female with a past medical history as listed below including osteoporosis, who presents to clinic today for follow-up after recently being seen in the ER for peptic ulcer disease.    10/15/2022 patient seen in the ER for right upper quadrant pain.  Had been using Goody powders for the pain.  CMP, CBC, lipase, urinalysis all normal.  EKG with sinus rhythm and otherwise normal.  CT abdomen pelvis with contrast showed a low-density thickening of the duodenum suspected ulcer disease with no perforation, solid and cystic density enlargement of left ovary unexpected for age and recommended pelvic ultrasound.  Low-density right adrenal nodule, usually adenoma.  Recommended further eval with adrenal protocol abdomen CT.  Right upper quadrant ultrasound with no gallstones.  Patient discharged in Pepcid 20 mg twice daily.    Today, patient presents to clinic and tells me that she took 2 weeks of 20 mg of Pepcid twice daily and all of her symptoms went away.  Prior to this she was having belching, heartburn, reflux and a right upper quadrant pain.  She has changed her diet and tries to avoid tomatoes and oranges and switched her dairy products and everything seems fine.  She has no acute complaints today.  Does describe a long history of using Goody powders for some arthritis in her neck as it is the only thing that seems to help.  She still uses them but makes sure that she has food on her stomach.    Denies prior colon cancer screening.    Denies fever, chills, weight loss, nausea, vomiting or symptoms that awaken her from sleep.  Past Medical History:  Diagnosis Date   Allergy    Arthritis    Asthma    Osteoporosis 06/22/2004    Past Surgical History:  Procedure Laterality Date   TRANSTHORACIC ECHOCARDIOGRAM  06/30/2014   Left ventricle: The cavity size was normal. Wall thickness was normal. Systolic function  was normal. The EF was in the range of 55% to 60%. Left ventricular diastolic function parameters were normal.   wisdom teeth      Current Outpatient Medications  Medication Sig Dispense Refill   albuterol (VENTOLIN HFA) 108 (90 Base) MCG/ACT inhaler Inhale 1-2 puffs into the lungs every 4 (four) hours as needed for wheezing or shortness of breath. 8 g 0   Aspirin-Acetaminophen-Caffeine (EXCEDRIN PO) Take 1 tablet by mouth as needed.     diphenhydrAMINE (BENADRYL) 25 MG tablet Take 25 mg by mouth daily as needed for allergies.     famotidine (PEPCID) 20 MG tablet Take 1 tablet (20 mg total) by mouth 2 (two) times daily. 30 tablet 0   Multiple Vitamin (MULTIVITAMIN) tablet Take 1 tablet by mouth daily.     No current facility-administered medications for this visit.    Allergies as of 12/30/2022 - Review Complete 12/30/2022  Allergen Reaction Noted   Chantix [varenicline] Other (See Comments) 09/13/2013   Codeine  12/04/2011   Mucinex [guaifenesin er]  05/28/2012   Wellbutrin [bupropion] Hives 09/13/2013    Family History  Problem Relation Age of Onset   Cancer Father        Lung Cancer - metastasis to brain and spine   Asthma Daughter    Diabetes Maternal Grandmother    Kidney disease Maternal Grandmother        ESRD due to DM   Heart failure Maternal Grandmother  CHF   Cancer Maternal Grandfather        melanoma   Dementia Maternal Grandfather 71   Heart disease Paternal Grandfather    Heart attack Paternal Grandfather 38   Hyperlipidemia Mother    Alzheimer's disease Paternal Grandmother 59    Social History   Socioeconomic History   Marital status: Widowed    Spouse name: Not on file   Number of children: 2   Years of education: Not on file   Highest education level: Not on file  Occupational History   Not on file  Tobacco Use   Smoking status: Every Day    Current packs/day: 1.00    Average packs/day: 1 pack/day for 35.0 years (35.0 ttl pk-yrs)     Types: Cigarettes   Smokeless tobacco: Never  Vaping Use   Vaping status: Never Used  Substance and Sexual Activity   Alcohol use: Yes    Alcohol/week: 0.0 standard drinks of alcohol    Comment: ~0.5 drink per year   Drug use: No   Sexual activity: Not Currently    Birth control/protection: None  Other Topics Concern   Not on file  Social History Narrative   Not on file   Social Determinants of Health   Financial Resource Strain: Not on file  Food Insecurity: Not on file  Transportation Needs: Not on file  Physical Activity: Not on file  Stress: Not on file  Social Connections: Not on file  Intimate Partner Violence: Not on file    Review of Systems:    Constitutional: No weight loss, fever or chills Skin: No rash  Cardiovascular: No chest pain   Respiratory: No SOB  Gastrointestinal: See HPI and otherwise negative Genitourinary: No dysuria Neurological: No headache, dizziness or syncope Musculoskeletal: No new muscle or joint pain Hematologic: No bleeding  Psychiatric: No history of depression or anxiety   Physical Exam:  Vital signs: BP 110/62   Pulse 67   Ht 5\' 4"  (1.626 m)   Wt 140 lb 3.2 oz (63.6 kg)   BMI 24.07 kg/m    Constitutional:   Pleasant Caucasian female appears to be in NAD, Well developed, Well nourished, alert and cooperative Head:  Normocephalic and atraumatic. Eyes:   PEERL, EOMI. No icterus. Conjunctiva pink. Ears:  Normal auditory acuity. Neck:  Supple Throat: Oral cavity and pharynx without inflammation, swelling or lesion.  Respiratory: Respirations even and unlabored. Lungs clear to auscultation bilaterally.   No wheezes, crackles, or rhonchi.  Cardiovascular: Normal S1, S2. No MRG. Regular rate and rhythm. No peripheral edema, cyanosis or pallor.  Gastrointestinal:  Soft, nondistended, nontender. No rebound or guarding. Normal bowel sounds. No appreciable masses or hepatomegaly. Rectal:  Not performed.  Msk:  Symmetrical without gross  deformities. Without edema, no deformity or joint abnormality.  Neurologic:  Alert and  oriented x4;  grossly normal neurologically.  Skin:   Dry and intact without significant lesions or rashes. Psychiatric: Oriented to person, place and time. Demonstrates good judgement and reason without abnormal affect or behaviors.  RELEVANT LABS AND IMAGING: CBC    Component Value Date/Time   WBC 9.8 10/15/2022 0935   RBC 3.95 10/15/2022 0935   HGB 12.8 10/15/2022 0935   HCT 37.3 10/15/2022 0935   PLT 294 10/15/2022 0935   MCV 94.4 10/15/2022 0935   MCV 97.4 (A) 05/28/2012 1327   MCH 32.4 10/15/2022 0935   MCHC 34.3 10/15/2022 0935   RDW 11.8 10/15/2022 0935   LYMPHSABS 2.4 10/15/2022 0935  MONOABS 0.5 10/15/2022 0935   EOSABS 0.2 10/15/2022 0935   BASOSABS 0.1 10/15/2022 0935    CMP     Component Value Date/Time   NA 136 10/15/2022 0935   NA 137 01/02/2021 1122   K 4.3 10/15/2022 0935   CL 105 10/15/2022 0935   CO2 23 10/15/2022 0935   GLUCOSE 92 10/15/2022 0935   BUN 13 10/15/2022 0935   BUN 11 01/02/2021 1122   CREATININE 0.73 10/15/2022 0935   CALCIUM 9.4 10/15/2022 0935   PROT 7.0 10/15/2022 0935   ALBUMIN 3.6 10/15/2022 0935   AST 17 10/15/2022 0935   ALT 16 10/15/2022 0935   ALKPHOS 79 10/15/2022 0935   BILITOT 0.4 10/15/2022 0935   GFRNONAA >60 10/15/2022 0935    Assessment: 1.  Screening for colorectal cancer: Patient is 89 and never had screening for colon cancer  2.  Right upper quadrant pain/belching: With CT as below, symptoms completely resolved on Pepcid 20 mg twice daily x 2 weeks, does have Goody powder history; consider duodenitis most likely versus PUD 3.  Abnormal CT of the abdomen: Showing likely duodenitis versus peptic ulcer disease  Plan: 1.  Scheduled patient for a screening colonoscopy and diagnostic EGD in the [facility] with Dr. Chales Abrahams.  Did provide the patient a detailed list of risks for the procedures and she agrees to proceed. Patient is appropriate  for endoscopic procedure(s) in the ambulatory ([facility]) setting.  2.  Would recommend the patient discontinue use of Goody powders. 3.  Recommend the patient follow-up with her PCP in regards to adrenal nodule and ovarian cyst.  She tells me she will call make an appointment. 4.  Patient to follow in clinic per recommendations after time of procedure.  Hyacinth Meeker, PA-C [facility] 12/30/2022, 1:57 PM

## 2022-12-30 NOTE — Patient Instructions (Signed)
Stop using Goody's Powders  Follow up with PCP regarding ovarian cyst.  You have been scheduled for an endoscopy and colonoscopy. Please follow the written instructions given to you at your visit today.  Please pick up your prep supplies at the pharmacy within the next 1-3 days.  If you use inhalers (even only as needed), please bring them with you on the day of your procedure.  DO NOT TAKE 7 DAYS PRIOR TO TEST- Trulicity (dulaglutide) Ozempic, Wegovy (semaglutide) Mounjaro (tirzepatide) Bydureon Bcise (exanatide extended release)  DO NOT TAKE 1 DAY PRIOR TO YOUR TEST Rybelsus (semaglutide) Adlyxin (lixisenatide) Victoza (liraglutide) Byetta (exanatide) ___________________________________________________________________________ _______________________________________________________  If your blood pressure at your visit was 140/90 or greater, please contact your primary care physician to follow up on this.  _______________________________________________________  If you are age 79 or older, your body mass index should be between 23-30. Your Body mass index is 24.07 kg/m. If this is out of the aforementioned range listed, please consider follow up with your Primary Care Provider.  If you are age 96 or younger, your body mass index should be between 19-25. Your Body mass index is 24.07 kg/m. If this is out of the aformentioned range listed, please consider follow up with your Primary Care Provider.   ________________________________________________________  The [facility] providers would like to encourage you to use [facility] to communicate with providers for non-urgent requests or questions.  Due to long hold times on the telephone, sending your provider a message by [facility] may be a faster and more efficient way to get a response.  Please allow 48 business hours for a response.  Please remember that this is for non-urgent requests.   _______________________________________________________

## 2023-01-03 NOTE — Progress Notes (Signed)
Agree with assessment/plan.  Raj Florestine Carmical, MD [facility] [phone]  

## 2023-03-16 ENCOUNTER — Encounter: Payer: Commercial Managed Care - PPO | Admitting: Gastroenterology

## 2023-09-14 ENCOUNTER — Ambulatory Visit
Admission: EM | Admit: 2023-09-14 | Discharge: 2023-09-14 | Disposition: A | Discharge status: Home / Self Care | Attending: Family Medicine | Admitting: Family Medicine

## 2023-09-14 DIAGNOSIS — J4541 Moderate persistent asthma with (acute) exacerbation: Secondary | ICD-10-CM

## 2023-09-14 DIAGNOSIS — R062 Wheezing: Secondary | ICD-10-CM

## 2023-09-14 DIAGNOSIS — R059 Cough, unspecified: Secondary | ICD-10-CM

## 2023-09-14 DIAGNOSIS — I739 Peripheral vascular disease, unspecified: Secondary | ICD-10-CM

## 2023-09-14 DIAGNOSIS — L03114 Cellulitis of left upper limb: Secondary | ICD-10-CM | POA: Diagnosis not present

## 2023-09-14 DIAGNOSIS — J45901 Unspecified asthma with (acute) exacerbation: Secondary | ICD-10-CM

## 2023-09-14 MED ORDER — ALBUTEROL SULFATE HFA 108 (90 BASE) MCG/ACT IN AERS
1.0000 | INHALATION_SPRAY | RESPIRATORY_TRACT | 0 refills | Status: AC | PRN
Start: 2023-09-14 — End: ?

## 2023-09-14 MED ORDER — DOXYCYCLINE HYCLATE 100 MG PO CAPS: 100.0000 mg | ORAL_CAPSULE | Freq: Two times a day (BID) | ORAL | 0 refills | Status: AC

## 2023-09-14 MED ORDER — PREDNISONE 20 MG PO TABS: ORAL_TABLET | ORAL | 0 refills | Status: AC

## 2023-09-14 NOTE — Discharge Instructions (Addendum)
 I am managing you for cellulitis as a primary problem causing your sick symptoms and likely aggravating your asthma. Start doxycycline  for the cellulitis infection. Use prednisone  for your asthma. I refilled albuterol  for you. Make sure you establish care with your PCP again for a recheck on your symptoms especially claudication. Keep your follow up with your cardiologist.

## 2023-09-14 NOTE — ED Triage Notes (Addendum)
 Pt reports she feels like she has fever, cough chills, body aches, legs cramping; redness, hot feeling and soreness in left arm x 2 days.   Requested albuterol  inhaler refill.

## 2023-09-14 NOTE — ED Provider Notes (Signed)
 [facility]  Note:  This document was prepared using Conservation officer, historic buildings and may include unintentional dictation errors.  MRN: [id number] DOB: 05-07-1965  Subjective:   Kristen Brewer is a 58 y.o. female presenting for 2 day history of malaise, fatigue, fever, productive cough, chills, body aches, hot sensation, low back pain of the right lower side, wheezing in the mornings. Has had cramping in her feet and lower legs waking her up and resolving after walking in the middle of the night. Also has a sore spot over the left upper arm.  She does take care of of multiple cats both indoor and outdoor.  However no known cat bite or cat scratch to the area.  She believes that as she reached toward feeding her cats outdoors, got stuck in her left arm by a bush.  Need a refill of albuterol  inhaler. Has asthma.  Has not followed up with her PCP in more than 3 years.  She does see a cardiologist.  Has had CT scans.  Has 30+ pack year history.  No current facility-administered medications for this encounter.  Current Outpatient Medications:    albuterol  (VENTOLIN  HFA) 108 (90 Base) MCG/ACT inhaler, Inhale 1-2 puffs into the lungs every 4 (four) hours as needed for wheezing or shortness of breath., Disp: 8 g, Rfl: 0   Aspirin-Acetaminophen -Caffeine (EXCEDRIN PO), Take 1 tablet by mouth as needed., Disp: , Rfl:    diphenhydrAMINE (BENADRYL) 25 MG tablet, Take 25 mg by mouth daily as needed for allergies., Disp: , Rfl:    famotidine  (PEPCID ) 20 MG tablet, Take 1 tablet (20 mg total) by mouth 2 (two) times daily., Disp: 30 tablet, Rfl: 0   Multiple Vitamin (MULTIVITAMIN) tablet, Take 1 tablet by mouth daily., Disp: , Rfl:    Allergies  Allergen Reactions   Chantix [Varenicline] Other (See Comments)    Makes patient aggitated   Codeine    Mucinex [Guaifenesin Er]    Wellbutrin [Bupropion] Hives    Past Medical History:  Diagnosis Date   Allergy    Arthritis     Asthma    Osteoporosis 06/22/2004     Past Surgical History:  Procedure Laterality Date   TRANSTHORACIC ECHOCARDIOGRAM  06/30/2014   Left ventricle: The cavity size was normal. Wall thickness was normal. Systolic function was normal. The EF was in the range of 55% to 60%. Left ventricular diastolic function parameters were normal.   wisdom teeth      Family History  Problem Relation Age of Onset   Cancer Father        Lung Cancer - metastasis to brain and spine   Asthma Daughter    Diabetes Maternal Grandmother    Kidney disease Maternal Grandmother        ESRD due to DM   Heart failure Maternal Grandmother        CHF   Cancer Maternal Grandfather        melanoma   Dementia Maternal Grandfather 56   Heart disease Paternal Grandfather    Heart attack Paternal Grandfather 41   Hyperlipidemia Mother    Alzheimer's disease Paternal Grandmother 41    Social History   Tobacco Use   Smoking status: Every Day    Current packs/day: 1.00    Average packs/day: 1 pack/day for 35.0 years (35.0 ttl pk-yrs)    Types: Cigarettes   Smokeless tobacco: Never  Vaping Use   Vaping status: Never Used  Substance Use Topics  Alcohol use: Yes    Alcohol/week: 0.0 standard drinks of alcohol    Comment: ~0.5 drink per year   Drug use: No    ROS   Objective:   Vitals: BP 114/78 (BP Location: Right Arm)   Pulse 90   Temp 98 F (36.7 C) (Oral)   Resp 16   SpO2 94%   Physical Exam Constitutional:      General: She is not in acute distress.    Appearance: Normal appearance. She is well-developed. She is not ill-appearing, toxic-appearing or diaphoretic.  HENT:     Head: Normocephalic and atraumatic.     Right Ear: External ear normal.     Left Ear: External ear normal.     Nose: Nose normal.     Mouth/Throat:     Mouth: Mucous membranes are moist.     Pharynx: No pharyngeal swelling, oropharyngeal exudate, posterior oropharyngeal erythema or uvula swelling.     Tonsils: No  tonsillar exudate or tonsillar abscesses. 0 on the right. 0 on the left.  Eyes:     General: No scleral icterus.       Right eye: No discharge.        Left eye: No discharge.     Extraocular Movements: Extraocular movements intact.  Cardiovascular:     Rate and Rhythm: Normal rate and regular rhythm.     Heart sounds: Normal heart sounds. No murmur heard.    No friction rub. No gallop.  Pulmonary:     Effort: Pulmonary effort is normal. No respiratory distress.     Breath sounds: No stridor. No wheezing, rhonchi or rales.  Chest:     Chest wall: No tenderness.  Skin:    General: Skin is warm and dry.      Neurological:     General: No focal deficit present.     Mental Status: She is alert and oriented to person, place, and time.     Cranial Nerves: No cranial nerve deficit.     Motor: No weakness.     Coordination: Coordination normal.     Gait: Gait normal.  Psychiatric:        Mood and Affect: Mood normal.        Behavior: Behavior normal.        Thought Content: Thought content normal.        Judgment: Judgment normal.      Assessment and Plan :   PDMP not reviewed this encounter.  1. Cellulitis of left upper extremity   2. Wheezing   3. Asthma with acute exacerbation   4. Cough   5. Moderate persistent asthma with (acute) exacerbation   6. Claudication (HCC)    Recommended bandaging for cellulitis with doxycycline  in addition to general wound care.  Refilled her albuterol .  Start prednisone  to help with an acute mild asthma exacerbation.  Deferred respiratory testing, blood work.  Deferred imaging given clear cardiopulmonary exam, hemodynamically stable vital signs.  Emphasized need for follow-up with her PCP and cardiologist as I suspect that she is having signs of claudication.  Counseled patient on potential for adverse effects with medications prescribed/recommended today, ER and return-to-clinic precautions discussed, patient verbalized understanding.    Christopher Savannah, [place] 09/14/23 1147
